# Patient Record
Sex: Female | Born: 1980 | Race: White | Hispanic: No | Smoking: Current every day smoker
Health system: Southern US, Community
[De-identification: ages and names within clinical notes are randomized; demographics above are authoritative.]

## PROBLEM LIST (undated history)

## (undated) DIAGNOSIS — F419 Anxiety disorder, unspecified: Secondary | ICD-10-CM

## (undated) DIAGNOSIS — M25512 Pain in left shoulder: Secondary | ICD-10-CM

## (undated) DIAGNOSIS — M542 Cervicalgia: Secondary | ICD-10-CM

## (undated) DIAGNOSIS — M25511 Pain in right shoulder: Secondary | ICD-10-CM

## (undated) DIAGNOSIS — C801 Malignant (primary) neoplasm, unspecified: Secondary | ICD-10-CM

## (undated) DIAGNOSIS — N809 Endometriosis, unspecified: Secondary | ICD-10-CM

## (undated) DIAGNOSIS — G8929 Other chronic pain: Secondary | ICD-10-CM

## (undated) DIAGNOSIS — G43909 Migraine, unspecified, not intractable, without status migrainosus: Secondary | ICD-10-CM

## (undated) DIAGNOSIS — F32A Depression, unspecified: Secondary | ICD-10-CM

## (undated) DIAGNOSIS — F329 Major depressive disorder, single episode, unspecified: Secondary | ICD-10-CM

## (undated) HISTORY — PX: LEEP: SHX91

## (undated) HISTORY — PX: TUBAL LIGATION: SHX77

---

## 2004-01-19 ENCOUNTER — Inpatient Hospital Stay (HOSPITAL_COMMUNITY): Admission: AD | Admit: 2004-01-19 | Discharge: 2004-01-19 | Payer: Self-pay | Admitting: Obstetrics and Gynecology

## 2005-08-04 ENCOUNTER — Inpatient Hospital Stay (HOSPITAL_COMMUNITY): Admission: AD | Admit: 2005-08-04 | Discharge: 2005-08-04 | Payer: Self-pay | Admitting: *Deleted

## 2009-11-06 ENCOUNTER — Ambulatory Visit: Payer: Self-pay | Admitting: Family Medicine

## 2009-11-06 DIAGNOSIS — N739 Female pelvic inflammatory disease, unspecified: Secondary | ICD-10-CM | POA: Insufficient documentation

## 2009-11-06 LAB — CONVERTED CEMR LAB
Ketones, urine, test strip: NEGATIVE
Specific Gravity, Urine: 1.025
Urobilinogen, UA: 0.2
Yeast Wet Prep HPF POC: NONE SEEN
pH: 7

## 2009-11-07 ENCOUNTER — Encounter: Payer: Self-pay | Admitting: Family Medicine

## 2009-11-07 LAB — CONVERTED CEMR LAB
Chlamydia, DNA Probe: NEGATIVE
GC Probe Amp, Genital: NEGATIVE

## 2015-06-19 ENCOUNTER — Emergency Department (HOSPITAL_COMMUNITY)
Admission: EM | Admit: 2015-06-19 | Discharge: 2015-06-19 | Disposition: A | Discharge status: Home / Self Care | Payer: Self-pay | Attending: Emergency Medicine | Admitting: Emergency Medicine

## 2015-06-19 ENCOUNTER — Encounter (HOSPITAL_COMMUNITY): Payer: Self-pay | Admitting: Emergency Medicine

## 2015-06-19 DIAGNOSIS — L089 Local infection of the skin and subcutaneous tissue, unspecified: Secondary | ICD-10-CM | POA: Insufficient documentation

## 2015-06-19 DIAGNOSIS — Z859 Personal history of malignant neoplasm, unspecified: Secondary | ICD-10-CM | POA: Insufficient documentation

## 2015-06-19 DIAGNOSIS — R197 Diarrhea, unspecified: Secondary | ICD-10-CM | POA: Insufficient documentation

## 2015-06-19 DIAGNOSIS — T798XXA Other early complications of trauma, initial encounter: Secondary | ICD-10-CM

## 2015-06-19 DIAGNOSIS — N939 Abnormal uterine and vaginal bleeding, unspecified: Secondary | ICD-10-CM | POA: Insufficient documentation

## 2015-06-19 DIAGNOSIS — Z720 Tobacco use: Secondary | ICD-10-CM | POA: Insufficient documentation

## 2015-06-19 HISTORY — DX: Malignant (primary) neoplasm, unspecified: C80.1

## 2015-06-19 MED ORDER — DOXYCYCLINE HYCLATE 100 MG PO CAPS: 100.0000 mg | ORAL_CAPSULE | Freq: Two times a day (BID) | ORAL | Status: DC

## 2015-06-19 MED ORDER — OXYCODONE-ACETAMINOPHEN 5-325 MG PO TABS
2.0000 | ORAL_TABLET | Freq: Once | ORAL | Status: AC
  Administered 2015-06-19: 2 via ORAL
  Filled 2015-06-19: qty 2

## 2015-06-19 MED ORDER — OXYCODONE-ACETAMINOPHEN 5-325 MG PO TABS: 1.0000 | ORAL_TABLET | ORAL | Status: DC | PRN

## 2015-06-19 MED ORDER — DOXYCYCLINE HYCLATE 100 MG PO TABS
100.0000 mg | ORAL_TABLET | Freq: Once | ORAL | Status: AC
  Administered 2015-06-19: 100 mg via ORAL
  Filled 2015-06-19: qty 1

## 2015-06-19 NOTE — Discharge Instructions (Signed)
You can also take benadryl for any itching or redness you may have

## 2015-06-19 NOTE — ED Notes (Signed)
Pt got tattoo to the rt forearm last week. Pt states the pain started getting bad yesterday.

## 2015-06-19 NOTE — ED Provider Notes (Signed)
CSN: [id number]     Arrival date & time 06/19/15  2011 History  This chart was scribed for Ripley Fraise, MD by Julien Nordmann, ED Scribe. This patient was seen in room [facility]17/[facility]17 and the patient's care was started at 8:38 PM.     Chief Complaint  Patient presents with  . Wound Infection  Patient gave verbal permission to utilize photo for medical documentation only The image was not stored on any personal device     Patient is a 34 y.o. female presenting with wound check. The history is provided by the patient. No language interpreter was used.  Wound Check This is a new problem. The current episode started yesterday. The problem occurs constantly. The problem has been gradually worsening. Pertinent negatives include no chest pain, no abdominal pain, no headaches and no shortness of breath. The symptoms are aggravated by exertion. Nothing relieves the symptoms.    HPI Comments: Maria Jenkins is a 34 y.o. female who presents to the Emergency Department complaining of a wound pain onset one day ago. She reports having associated diarrhea. Pt notes she got a tattoo on her right forearm last week and notes yesterday the pain started getting worse. She reports having prior tattoos but has never had an infection with tattoos before. Pt denies fever.  Past Medical History  Diagnosis Date  . Cancer   . Endometriosis of cervix    Past Surgical History  Procedure Laterality Date  . Leep     History reviewed. No pertinent family history. History  Substance Use Topics  . Smoking status: Current Every Day Smoker    Types: Cigarettes  . Smokeless tobacco: Not on file  . Alcohol Use: No   OB History    No data available     Review of Systems  Constitutional: Negative for fever.  Respiratory: Negative for shortness of breath.   Cardiovascular: Negative for chest pain.  Gastrointestinal: Negative for abdominal pain.  Genitourinary: Positive for vaginal bleeding.  Skin: Positive for  wound.  Neurological: Negative for headaches.  All other systems reviewed and are negative.     Allergies  Review of patient's allergies indicates not on file.  Home Medications   Prior to Admission medications   Not on File  Triage vitals: BP 119/76 mmHg  Pulse 92  Temp(Src) 97.6 F (36.4 C)  Resp 18  Ht 5\' 3"  (1.6 m)  Wt 110 lb (49.896 kg)  BMI 19.49 kg/m2  SpO2 100%  Physical Exam  CONSTITUTIONAL: Well developed/well nourished HEAD: Normocephalic/atraumatic EYES: EOMI/PERRL ENMT: Mucous membranes moist NECK: supple no meningeal signs SPINE/BACK:entire spine nontender CV: S1/S2 noted, no murmurs/rubs/gallops noted LUNGS: Lungs are clear to auscultation bilaterally, no apparent distress ABDOMEN: soft, nontender, no rebound or guarding, bowel sounds noted throughout abdomen GU:no cva tenderness NEURO: Pt is awake/alert/appropriate, moves all extremitiesx4.  No facial droop.   EXTREMITIES: pulses normal/equal, full ROM, no streaking to right arm, no crepitus, no drainage, full ROM of right wrist SKIN: warm, color normal, see photo PSYCH: no abnormalities of mood noted, alert and oriented to situation       ED Course  Procedures  DIAGNOSTIC STUDIES: Oxygen Saturation is 100% on RA, normal by my interpretation.  COORDINATION OF CARE:  8:42 PM Discussed treatment plan which includes percocet and doxycycline with pt at bedside and pt agreed to plan.   Pt without streaking, no crepitus, no edema, no signs of trauma, only localized wound tenderness Will place on doxycycline She can use  benadryl for any itching  Short course of pain meds Discussed strict return precautions   She also mentioned recent vag bleeding even though she is on DEPO shot and has been seen at an urgent care.  She denies pregnancy.  She denies any weakness.  She does not want further workup but reports she will f/u with GYN  MDM   Final diagnoses:  Wound infection, initial encounter     Nursing notes including past medical history and social history reviewed and considered in documentation   I personally performed the services described in this documentation, which was scribed in my presence. The recorded information has been reviewed and is accurate.      Ripley Fraise, MD 06/19/15 2118

## 2015-06-20 ENCOUNTER — Emergency Department (HOSPITAL_COMMUNITY): Payer: Self-pay

## 2015-06-20 ENCOUNTER — Emergency Department (HOSPITAL_COMMUNITY)
Admission: EM | Admit: 2015-06-20 | Discharge: 2015-06-20 | Disposition: A | Discharge status: Home / Self Care | Payer: Self-pay | Attending: Emergency Medicine | Admitting: Emergency Medicine

## 2015-06-20 ENCOUNTER — Encounter (HOSPITAL_COMMUNITY): Payer: Self-pay | Admitting: Emergency Medicine

## 2015-06-20 DIAGNOSIS — T798XXA Other early complications of trauma, initial encounter: Secondary | ICD-10-CM

## 2015-06-20 DIAGNOSIS — Z859 Personal history of malignant neoplasm, unspecified: Secondary | ICD-10-CM | POA: Insufficient documentation

## 2015-06-20 DIAGNOSIS — R5383 Other fatigue: Secondary | ICD-10-CM | POA: Insufficient documentation

## 2015-06-20 DIAGNOSIS — Z87448 Personal history of other diseases of urinary system: Secondary | ICD-10-CM | POA: Insufficient documentation

## 2015-06-20 DIAGNOSIS — L089 Local infection of the skin and subcutaneous tissue, unspecified: Secondary | ICD-10-CM | POA: Insufficient documentation

## 2015-06-20 DIAGNOSIS — Z720 Tobacco use: Secondary | ICD-10-CM | POA: Insufficient documentation

## 2015-06-20 DIAGNOSIS — Z792 Long term (current) use of antibiotics: Secondary | ICD-10-CM | POA: Insufficient documentation

## 2015-06-20 LAB — CBC WITH DIFFERENTIAL/PLATELET
BASOS ABS: 0 10*3/uL (ref 0.0–0.1)
Basophils Relative: 0 % (ref 0–1)
Eosinophils Absolute: 0.2 10*3/uL (ref 0.0–0.7)
Eosinophils Relative: 2 % (ref 0–5)
HCT: 37.9 % (ref 36.0–46.0)
Hemoglobin: 12.6 g/dL (ref 12.0–15.0)
LYMPHS PCT: 32 % (ref 12–46)
Lymphs Abs: 2.8 10*3/uL (ref 0.7–4.0)
MCH: 30.6 pg (ref 26.0–34.0)
MCHC: 33.2 g/dL (ref 30.0–36.0)
MCV: 92 fL (ref 78.0–100.0)
Monocytes Absolute: 0.5 10*3/uL (ref 0.1–1.0)
Monocytes Relative: 5 % (ref 3–12)
NEUTROS PCT: 61 % (ref 43–77)
Neutro Abs: 5.4 10*3/uL (ref 1.7–7.7)
Platelets: 264 10*3/uL (ref 150–400)
RBC: 4.12 MIL/uL (ref 3.87–5.11)
RDW: 14.9 % (ref 11.5–15.5)
WBC: 8.8 10*3/uL (ref 4.0–10.5)

## 2015-06-20 LAB — BASIC METABOLIC PANEL
Anion gap: 6 (ref 5–15)
BUN: 6 mg/dL (ref 6–20)
CALCIUM: 8.9 mg/dL (ref 8.9–10.3)
CO2: 27 mmol/L (ref 22–32)
CREATININE: 0.61 mg/dL (ref 0.44–1.00)
Chloride: 106 mmol/L (ref 101–111)
GFR calc Af Amer: >60 mL/min (ref >60)
GFR calc non Af Amer: >60 mL/min (ref >60)
GLUCOSE: 87 mg/dL (ref 65–99)
Potassium: 4.1 mmol/L (ref 3.5–5.1)
Sodium: 139 mmol/L (ref 135–145)

## 2015-06-20 LAB — LACTIC ACID, PLASMA: LACTIC ACID, VENOUS: 0.9 mmol/L (ref 0.5–2.0)

## 2015-06-20 MED ORDER — OXYCODONE-ACETAMINOPHEN 5-325 MG PO TABS: 1.0000 | ORAL_TABLET | Freq: Four times a day (QID) | ORAL | Status: DC | PRN

## 2015-06-20 MED ORDER — CLINDAMYCIN PHOSPHATE 600 MG/50ML IV SOLN
600.0000 mg | Freq: Once | INTRAVENOUS | Status: AC
  Administered 2015-06-20: 600 mg via INTRAVENOUS
  Filled 2015-06-20: qty 50

## 2015-06-20 MED ORDER — ONDANSETRON HCL 4 MG/2ML IJ SOLN: 4.0000 mg | Freq: Once | INTRAMUSCULAR | Status: DC

## 2015-06-20 MED ORDER — OXYCODONE-ACETAMINOPHEN 5-325 MG PO TABS
1.0000 | ORAL_TABLET | Freq: Once | ORAL | Status: AC
  Administered 2015-06-20: 1 via ORAL
  Filled 2015-06-20: qty 1

## 2015-06-20 MED ORDER — FENTANYL CITRATE (PF) 100 MCG/2ML IJ SOLN
50.0000 ug | Freq: Once | INTRAMUSCULAR | Status: AC
  Administered 2015-06-20: 50 ug via INTRAVENOUS
  Filled 2015-06-20: qty 2

## 2015-06-20 MED ORDER — MELOXICAM 15 MG PO TABS: 15.0000 mg | ORAL_TABLET | Freq: Every day | ORAL | Status: AC

## 2015-06-20 MED ORDER — PROMETHAZINE HCL 12.5 MG PO TABS
12.5000 mg | ORAL_TABLET | Freq: Once | ORAL | Status: AC
  Administered 2015-06-20: 12.5 mg via ORAL
  Filled 2015-06-20: qty 1

## 2015-06-20 MED ORDER — KETOROLAC TROMETHAMINE 10 MG PO TABS
10.0000 mg | ORAL_TABLET | Freq: Once | ORAL | Status: AC
  Administered 2015-06-20: 10 mg via ORAL
  Filled 2015-06-20: qty 1

## 2015-06-20 MED ORDER — ONDANSETRON HCL 4 MG/2ML IJ SOLN
4.0000 mg | Freq: Once | INTRAMUSCULAR | Status: AC
  Administered 2015-06-20: 4 mg via INTRAVENOUS
  Filled 2015-06-20: qty 2

## 2015-06-20 MED ORDER — BACITRACIN-NEOMYCIN-POLYMYXIN 400-5-5000 EX OINT
TOPICAL_OINTMENT | Freq: Once | CUTANEOUS | Status: AC
  Administered 2015-06-20: 1 via TOPICAL
  Filled 2015-06-20: qty 1

## 2015-06-20 NOTE — ED Notes (Signed)
Patient was seen here last night for an infection where she received a tattoo 1 week ago. States she was put on antibiotics and told to come back if worse. States pain and swelling is worse today.

## 2015-06-20 NOTE — Discharge Instructions (Signed)
Please soak your arm in warm Epsom salt water for 10-15 minutes daily, then apply Neosporin/health for dressing until the wounds heal. Please finish the doxycycline as previously ordered. Please use mobile daily, may use Tylenol for mild to moderate pain, may use Percocet for more severe pain. Percocet may cause drowsiness, please use this medication with caution. Please set up a follow-up appointment with Dr. Laverta Baltimore for additional evaluation of your wound. Your complete blood count, vital signs were all well within normal limits tonight.

## 2015-06-21 NOTE — ED Provider Notes (Signed)
CSN: [id number]     Arrival date & time 06/20/15  1736 History   First MD Initiated Contact with Patient 06/20/15 1750     Chief Complaint  Patient presents with  . Wound Infection     (Consider location/radiation/quality/duration/timing/severity/associated sxs/prior Treatment) HPI Comments: Patient is a 34 year old female who presents to the emergency department for recheck of a wound, and for additional evaluation. The patient states that she was seen in the emergency department on June 22 for evaluation of an infection related to a tattoo. The patient had the tattoo done approximately a week ago. She was evaluated and found to have signs of infection. She was treated with doxycycline and pain medication. She presents today because she states that she is feeling worse, she is having excruciating pain, and she is concerned that something bad is happening. There's been no high fever reported. The patient states she has noted some mild drainage present, and she also feels like the area might be swelling some.  The history is provided by the patient.    Past Medical History  Diagnosis Date  . Cancer   . Endometriosis of cervix    Past Surgical History  Procedure Laterality Date  . Leep     History reviewed. No pertinent family history. History  Substance Use Topics  . Smoking status: Current Every Day Smoker    Types: Cigarettes  . Smokeless tobacco: Not on file  . Alcohol Use: No   OB History    No data available     Review of Systems  Constitutional: Positive for appetite change and fatigue.  Skin: Positive for wound.  All other systems reviewed and are negative.     Allergies  Review of patient's allergies indicates no known allergies.  Home Medications   Prior to Admission medications   Medication Sig Start Date End Date Taking? Authorizing Provider  ALPRAZolam Duanne Moron) 1 MG tablet Take 1 mg by mouth 3 (three) times daily as needed for anxiety.  05/10/15  Yes  Historical Provider, MD  doxycycline (VIBRAMYCIN) 100 MG capsule Take 1 capsule (100 mg total) by mouth 2 (two) times daily. One po bid x 7 days 06/19/15  Yes Ripley Fraise, MD  sertraline (ZOLOFT) 100 MG tablet Take 100 mg by mouth at bedtime. 04/12/15  Yes Historical Provider, MD  meloxicam (MOBIC) 15 MG tablet Take 1 tablet (15 mg total) by mouth daily. 06/20/15   Lily Kocher, PA-C  oxyCODONE-acetaminophen (PERCOCET/ROXICET) 5-325 MG per tablet Take 1 tablet by mouth every 6 (six) hours as needed. 06/20/15   Lily Kocher, PA-C   BP 123/71 mmHg  Pulse 81  Temp(Src) 97.6 F (36.4 C) (Oral)  Resp 18  Ht 5\' 3"  (1.6 m)  Wt 110 lb (49.896 kg)  BMI 19.49 kg/m2  SpO2 100%  LMP 06/06/2015 Physical Exam  Constitutional: She is oriented to person, place, and time. She appears well-developed and well-nourished.  Non-toxic appearance.  HENT:  Head: Normocephalic.  Right Ear: Tympanic membrane and external ear normal.  Left Ear: Tympanic membrane and external ear normal.  Eyes: EOM and lids are normal. Pupils are equal, round, and reactive to light.  Neck: Normal range of motion. Neck supple. Carotid bruit is not present.  Cardiovascular: Normal rate, regular rhythm, normal heart sounds, intact distal pulses and normal pulses.   Pulmonary/Chest: Breath sounds normal. No respiratory distress.  Abdominal: Soft. Bowel sounds are normal. There is no tenderness. There is no guarding.  Musculoskeletal: Normal range of motion.  Lymphadenopathy:       Head (right side): No submandibular adenopathy present.       Head (left side): No submandibular adenopathy present.    She has no cervical adenopathy.  Neurological: She is alert and oriented to person, place, and time. She has normal strength. No cranial nerve deficit or sensory deficit.  Skin: Skin is warm and dry.  Patient has a tattoo of the right forearm, and the side of the right hand. There are multiple denuded ulcers throughout the tattoo area.  There no red streaks going up the arm. The area is warm, but not hot. There no palpable on no of the biceps triceps or the axilla area.  Psychiatric: Her speech is normal. Her mood appears anxious.  Nursing note and vitals reviewed.   ED Course  Patient seen with me by Dr. Lacinda Axon.   Procedures (including critical care time) Labs Review Labs Reviewed  CBC WITH DIFFERENTIAL/PLATELET  LACTIC ACID, PLASMA  BASIC METABOLIC PANEL    Imaging Review Dg Forearm Right  06/20/2015   CLINICAL DATA:  Severe right forearm pain since tattoo was placed 1 week ago. Possible infection. Initial encounter.  EXAM: RIGHT FOREARM - 2 VIEW  COMPARISON:  None.  FINDINGS: There is no evidence of fracture or other focal bone lesions. Soft tissues are unremarkable. No radiopaque foreign body seen.  IMPRESSION: Normal right forearm.   Electronically Signed   By: Marijo Conception, M.D.   On: 06/20/2015 19:01     EKG Interpretation None      MDM The lactic acid is normal at 0.9. The complete blood count is normal, and the basic metabolic panel is also normal. X-ray of the right forearm is negative for gas, there no bone lesions or bony involvement.  The patient was treated with clindamycin intravenously. Patient was also given pain medication intravenously. The patient is very concerned about her pain and the inability to rest because of this issue. A prescription for Percocet and mobile given to the patient. The patient will complete the doxycycline as previously ordered. The patient is to follow-up with Dr. Laverta Baltimore, or return to the emergency department if not improving.    Final diagnoses:  Wound infection, initial encounter    **I have reviewed nursing notes, vital signs, and all appropriate lab and imaging results for this patient.Lily Kocher, PA-C 06/21/15 0109  Nat Christen, MD 06/21/15 [phone]

## 2015-10-09 ENCOUNTER — Encounter (HOSPITAL_COMMUNITY): Payer: Self-pay | Admitting: Emergency Medicine

## 2015-10-09 ENCOUNTER — Emergency Department (HOSPITAL_COMMUNITY)
Admission: EM | Admit: 2015-10-09 | Discharge: 2015-10-09 | Disposition: A | Discharge status: Home / Self Care | Payer: Self-pay | Attending: Emergency Medicine | Admitting: Emergency Medicine

## 2015-10-09 DIAGNOSIS — Z791 Long term (current) use of non-steroidal anti-inflammatories (NSAID): Secondary | ICD-10-CM | POA: Insufficient documentation

## 2015-10-09 DIAGNOSIS — Z720 Tobacco use: Secondary | ICD-10-CM | POA: Insufficient documentation

## 2015-10-09 DIAGNOSIS — H9209 Otalgia, unspecified ear: Secondary | ICD-10-CM | POA: Insufficient documentation

## 2015-10-09 DIAGNOSIS — Z859 Personal history of malignant neoplasm, unspecified: Secondary | ICD-10-CM | POA: Insufficient documentation

## 2015-10-09 DIAGNOSIS — K0889 Other specified disorders of teeth and supporting structures: Secondary | ICD-10-CM | POA: Insufficient documentation

## 2015-10-09 DIAGNOSIS — Z8742 Personal history of other diseases of the female genital tract: Secondary | ICD-10-CM | POA: Insufficient documentation

## 2015-10-09 MED ORDER — AMOXICILLIN 500 MG PO CAPS: 500.0000 mg | ORAL_CAPSULE | Freq: Three times a day (TID) | ORAL | Status: DC

## 2015-10-09 MED ORDER — DICLOFENAC SODIUM 50 MG PO TBEC: 50.0000 mg | DELAYED_RELEASE_TABLET | Freq: Two times a day (BID) | ORAL | Status: AC

## 2015-10-09 MED ORDER — KETOROLAC TROMETHAMINE 60 MG/2ML IM SOLN
60.0000 mg | Freq: Once | INTRAMUSCULAR | Status: AC
  Administered 2015-10-09: 60 mg via INTRAMUSCULAR
  Filled 2015-10-09: qty 2

## 2015-10-09 NOTE — ED Provider Notes (Signed)
CSN: [id number]     Arrival date & time 10/09/15  1450 History   First MD Initiated Contact with Patient 10/09/15 1459     Chief Complaint  Patient presents with  . Otalgia     (Consider location/radiation/quality/duration/timing/severity/associated sxs/prior Treatment) Patient is a 34 y.o. female presenting with tooth pain. The history is provided by the patient. No language interpreter was used.  Dental Pain Location:  Upper Upper teeth location:  14/LU 1st molar and 13/LU 2nd bicuspid Quality:  Shooting and sharp Severity:  Severe Onset quality:  Gradual Duration:  1 day Timing:  Constant Progression:  Worsening Chronicity:  New Context: not abscess and normal dentition   Relieved by:  Nothing Worsened by:  Nothing tried Ineffective treatments:  None tried Associated symptoms: no facial swelling and no neck pain   Risk factors: no periodontal disease     Past Medical History  Diagnosis Date  . Cancer ([place])   . Endometriosis of cervix    Past Surgical History  Procedure Laterality Date  . Leep     History reviewed. No pertinent family history. Social History  Substance Use Topics  . Smoking status: Current Every Day Smoker -- 0.50 packs/day    Types: Cigarettes  . Smokeless tobacco: None  . Alcohol Use: No   OB History    No data available     Review of Systems  HENT: Positive for ear pain. Negative for facial swelling.   Musculoskeletal: Negative for neck pain.  All other systems reviewed and are negative.     Allergies  Review of patient's allergies indicates no known allergies.  Home Medications   Prior to Admission medications   Medication Sig Start Date End Date Taking? Authorizing Provider  ALPRAZolam Duanne Moron) 1 MG tablet Take 1 mg by mouth 3 (three) times daily as needed for anxiety.  05/10/15   Historical Provider, MD  amoxicillin (AMOXIL) 500 MG capsule Take 1 capsule (500 mg total) by mouth 3 (three) times daily. 10/09/15   Fransico Meadow,  PA-C  diclofenac (VOLTAREN) 50 MG EC tablet Take 1 tablet (50 mg total) by mouth 2 (two) times daily. 10/09/15   Fransico Meadow, PA-C  doxycycline (VIBRAMYCIN) 100 MG capsule Take 1 capsule (100 mg total) by mouth 2 (two) times daily. One po bid x 7 days 06/19/15   Ripley Fraise, MD  meloxicam (MOBIC) 15 MG tablet Take 1 tablet (15 mg total) by mouth daily. 06/20/15   Lily Kocher, PA-C  oxyCODONE-acetaminophen (PERCOCET/ROXICET) 5-325 MG per tablet Take 1 tablet by mouth every 6 (six) hours as needed. 06/20/15   Lily Kocher, PA-C  sertraline (ZOLOFT) 100 MG tablet Take 100 mg by mouth at bedtime. 04/12/15   Historical Provider, MD   BP 132/84 mmHg  Pulse 106  Temp(Src) 97.9 F (36.6 C) (Oral)  Resp 16  Ht 5\' 3"  (1.6 m)  Wt 105 lb (47.628 kg)  BMI 18.60 kg/m2  SpO2 100%  LMP 09/25/2015 Physical Exam  Constitutional: She is oriented to person, place, and time. She appears well-developed and well-nourished.  HENT:  Head: Normocephalic and atraumatic.  Right Ear: External ear normal.  Left Ear: External ear normal.  Mouth/Throat: Oropharynx is clear and moist.  tms are clear.  Pt has slightly bluish area between teeth left upper mouth  No obvious cavity.  Eyes: Conjunctivae and EOM are normal. Pupils are equal, round, and reactive to light.  Neck: Normal range of motion.  Cardiovascular: Normal rate and normal heart  sounds.   Pulmonary/Chest: Effort normal.  Abdominal: Soft.  Musculoskeletal: Normal range of motion.  Neurological: She is alert and oriented to person, place, and time. She has normal reflexes.  Skin: Skin is warm.  Psychiatric: She has a normal mood and affect.  Nursing note and vitals reviewed.   ED Course  Procedures (including critical care time) Labs Review Labs Reviewed - No data to display  Imaging Review No results found. I have personally reviewed and evaluated these images and lab results as part of my medical decision-making.   EKG  Interpretation None      MDM   Final diagnoses:  Toothache    amoxicilian voltaren Pt given torodol IM.    Hollace Kinnier Darwin, PA-C 10/09/15 1554  Milton Ferguson, MD 10/10/15 1537

## 2015-10-09 NOTE — Discharge Instructions (Signed)

## 2015-10-09 NOTE — ED Notes (Signed)
Pt reports ear pain since yesterday in left ear. Pt denies drainage.  Pt has put peroxide in ear, but only made it worse.

## 2015-10-31 ENCOUNTER — Emergency Department (HOSPITAL_COMMUNITY): Payer: Self-pay

## 2015-10-31 ENCOUNTER — Encounter (HOSPITAL_COMMUNITY): Payer: Self-pay

## 2015-10-31 ENCOUNTER — Emergency Department (HOSPITAL_COMMUNITY)
Admission: EM | Admit: 2015-10-31 | Discharge: 2015-10-31 | Disposition: A | Discharge status: Home / Self Care | Payer: Self-pay | Attending: Emergency Medicine | Admitting: Emergency Medicine

## 2015-10-31 DIAGNOSIS — G43909 Migraine, unspecified, not intractable, without status migrainosus: Secondary | ICD-10-CM | POA: Insufficient documentation

## 2015-10-31 DIAGNOSIS — G8929 Other chronic pain: Secondary | ICD-10-CM | POA: Insufficient documentation

## 2015-10-31 DIAGNOSIS — R112 Nausea with vomiting, unspecified: Secondary | ICD-10-CM | POA: Insufficient documentation

## 2015-10-31 DIAGNOSIS — R197 Diarrhea, unspecified: Secondary | ICD-10-CM | POA: Insufficient documentation

## 2015-10-31 DIAGNOSIS — N760 Acute vaginitis: Secondary | ICD-10-CM | POA: Insufficient documentation

## 2015-10-31 DIAGNOSIS — Z791 Long term (current) use of non-steroidal anti-inflammatories (NSAID): Secondary | ICD-10-CM | POA: Insufficient documentation

## 2015-10-31 DIAGNOSIS — B9689 Other specified bacterial agents as the cause of diseases classified elsewhere: Secondary | ICD-10-CM

## 2015-10-31 DIAGNOSIS — Z3202 Encounter for pregnancy test, result negative: Secondary | ICD-10-CM | POA: Insufficient documentation

## 2015-10-31 DIAGNOSIS — Z792 Long term (current) use of antibiotics: Secondary | ICD-10-CM | POA: Insufficient documentation

## 2015-10-31 DIAGNOSIS — Z720 Tobacco use: Secondary | ICD-10-CM | POA: Insufficient documentation

## 2015-10-31 DIAGNOSIS — R109 Unspecified abdominal pain: Secondary | ICD-10-CM

## 2015-10-31 HISTORY — DX: Pain in left shoulder: M25.512

## 2015-10-31 HISTORY — DX: Cervicalgia: M54.2

## 2015-10-31 HISTORY — DX: Endometriosis, unspecified: N80.9

## 2015-10-31 HISTORY — DX: Migraine, unspecified, not intractable, without status migrainosus: G43.909

## 2015-10-31 HISTORY — DX: Other chronic pain: G89.29

## 2015-10-31 HISTORY — DX: Pain in right shoulder: M25.511

## 2015-10-31 LAB — COMPREHENSIVE METABOLIC PANEL
ALBUMIN: 4 g/dL (ref 3.5–5.0)
ALT: 12 U/L — ABNORMAL LOW (ref 14–54)
ANION GAP: 4 — AB (ref 5–15)
AST: 17 U/L (ref 15–41)
Alkaline Phosphatase: 57 U/L (ref 38–126)
BILIRUBIN TOTAL: 0.6 mg/dL (ref 0.3–1.2)
BUN: 12 mg/dL (ref 6–20)
CALCIUM: 9.1 mg/dL (ref 8.9–10.3)
CO2: 26 mmol/L (ref 22–32)
Chloride: 108 mmol/L (ref 101–111)
Creatinine, Ser: 0.54 mg/dL (ref 0.44–1.00)
Glucose, Bld: 93 mg/dL (ref 65–99)
POTASSIUM: 4.1 mmol/L (ref 3.5–5.1)
Sodium: 138 mmol/L (ref 135–145)
Total Protein: 6.9 g/dL (ref 6.5–8.1)

## 2015-10-31 LAB — URINALYSIS, ROUTINE W REFLEX MICROSCOPIC
BILIRUBIN URINE: NEGATIVE
GLUCOSE, UA: NEGATIVE mg/dL
HGB URINE DIPSTICK: NEGATIVE
KETONES UR: NEGATIVE mg/dL
Nitrite: NEGATIVE
PH: 7.5 (ref 5.0–8.0)
Protein, ur: NEGATIVE mg/dL
Specific Gravity, Urine: 1.02 (ref 1.005–1.030)
Urobilinogen, UA: 0.2 mg/dL (ref 0.0–1.0)

## 2015-10-31 LAB — PREGNANCY, URINE: PREG TEST UR: NEGATIVE

## 2015-10-31 LAB — CBC WITH DIFFERENTIAL/PLATELET
BASOS ABS: 0 10*3/uL (ref 0.0–0.1)
BASOS PCT: 0 %
Eosinophils Absolute: 0.2 10*3/uL (ref 0.0–0.7)
Eosinophils Relative: 2 %
HEMATOCRIT: 40.5 % (ref 36.0–46.0)
HEMOGLOBIN: 13.6 g/dL (ref 12.0–15.0)
Lymphocytes Relative: 21 %
Lymphs Abs: 2.1 10*3/uL (ref 0.7–4.0)
MCH: 30.6 pg (ref 26.0–34.0)
MCHC: 33.6 g/dL (ref 30.0–36.0)
MCV: 91 fL (ref 78.0–100.0)
Monocytes Absolute: 0.7 10*3/uL (ref 0.1–1.0)
Monocytes Relative: 7 %
NEUTROS ABS: 7 10*3/uL (ref 1.7–7.7)
NEUTROS PCT: 70 %
Platelets: 283 10*3/uL (ref 150–400)
RBC: 4.45 MIL/uL (ref 3.87–5.11)
RDW: 14.5 % (ref 11.5–15.5)
WBC: 9.9 10*3/uL (ref 4.0–10.5)

## 2015-10-31 LAB — WET PREP, GENITAL
Trich, Wet Prep: NONE SEEN
Yeast Wet Prep HPF POC: NONE SEEN

## 2015-10-31 LAB — URINE MICROSCOPIC-ADD ON

## 2015-10-31 LAB — LIPASE, BLOOD: LIPASE: 26 U/L (ref 11–51)

## 2015-10-31 MED ORDER — ONDANSETRON HCL 4 MG/2ML IJ SOLN
4.0000 mg | INTRAMUSCULAR | Status: AC | PRN
  Administered 2015-10-31 (×2): 4 mg via INTRAVENOUS
  Filled 2015-10-31 (×2): qty 2

## 2015-10-31 MED ORDER — NAPROXEN 250 MG PO TABS: 250.0000 mg | ORAL_TABLET | Freq: Two times a day (BID) | ORAL | Status: AC | PRN

## 2015-10-31 MED ORDER — PROMETHAZINE HCL 25 MG PO TABS: 25.0000 mg | ORAL_TABLET | Freq: Four times a day (QID) | ORAL | Status: AC | PRN

## 2015-10-31 MED ORDER — SODIUM CHLORIDE 0.9 % IV BOLUS (SEPSIS)
1000.0000 mL | Freq: Once | INTRAVENOUS | Status: AC
  Administered 2015-10-31: 1000 mL via INTRAVENOUS

## 2015-10-31 MED ORDER — IOHEXOL 300 MG/ML  SOLN
100.0000 mL | Freq: Once | INTRAMUSCULAR | Status: AC | PRN
  Administered 2015-10-31: 100 mL via INTRAVENOUS

## 2015-10-31 MED ORDER — TRAMADOL HCL 50 MG PO TABS: 50.0000 mg | ORAL_TABLET | Freq: Four times a day (QID) | ORAL | Status: AC | PRN

## 2015-10-31 MED ORDER — KETOROLAC TROMETHAMINE 30 MG/ML IJ SOLN
30.0000 mg | Freq: Once | INTRAMUSCULAR | Status: AC
  Administered 2015-10-31: 30 mg via INTRAVENOUS
  Filled 2015-10-31: qty 1

## 2015-10-31 MED ORDER — SODIUM CHLORIDE 0.9 % IV SOLN
INTRAVENOUS | Status: DC
  Administered 2015-10-31: 12:00:00 via INTRAVENOUS

## 2015-10-31 MED ORDER — MORPHINE SULFATE (PF) 4 MG/ML IV SOLN
4.0000 mg | INTRAVENOUS | Status: AC | PRN
  Administered 2015-10-31 (×2): 4 mg via INTRAVENOUS
  Filled 2015-10-31 (×2): qty 1

## 2015-10-31 MED ORDER — METRONIDAZOLE 500 MG PO TABS: 500.0000 mg | ORAL_TABLET | Freq: Two times a day (BID) | ORAL | Status: AC

## 2015-10-31 NOTE — ED Notes (Signed)
Patient with no complaints at this time. Respirations even and unlabored. Skin warm/dry. Discharge instructions reviewed with patient at this time. Patient given opportunity to voice concerns/ask questions. IV removed per policy and band-aid applied to site. Patient discharged at this time and left Emergency Department with steady gait.  

## 2015-10-31 NOTE — ED Notes (Signed)
Pt c/o n/v/d and lower abd pain since yesterday.

## 2015-10-31 NOTE — Discharge Instructions (Signed)
°Emergency Department Resource Guide °1) Find a Doctor and Pay Out of Pocket °Although you won't have to find out who is covered by your insurance plan, it is a good idea to ask around and get recommendations. You will then need to call the office and see if the doctor you have chosen will accept you as a new patient and what types of options they offer for patients who are self-pay. Some doctors offer discounts or will set up payment plans for their patients who do not have insurance, but you will need to ask so you aren't surprised when you get to your appointment. ° °2) Contact Your Local Health Department °Not all health departments have doctors that can see patients for sick visits, but many do, so it is worth a call to see if yours does. If you don't know where your local health department is, you can check in your phone book. The CDC also has a tool to help you locate your state's health department, and many state websites also have listings of all of their local health departments. ° °3) Find a Walk-in Clinic °If your illness is not likely to be very severe or complicated, you may want to try a walk in clinic. These are popping up all over the country in pharmacies, drugstores, and shopping centers. They're usually staffed by nurse practitioners or physician assistants that have been trained to treat common illnesses and complaints. They're usually fairly quick and inexpensive. However, if you have serious medical issues or chronic medical problems, these are probably not your best option. ° °No Primary Care Doctor: °- Call [facility] at  832-8000 - they can help you locate a primary care doctor that  accepts your insurance, provides certain services, etc. °- Physician Referral Service- 1-800-533-3463 ° °Chronic Pain Problems: °Organization         Address  Phone   Notes  °[facility]  (336) 297-2271 Patients need to be referred by their primary care doctor.  ° °Medication  Assistance: °Organization         Address  Phone   Notes  °[place] [facility] [address] °[place], [place] [address] (336) 641-8030 --Must be a resident of [place] °-- Must have NO insurance coverage whatsoever (no Medicaid/ Medicare, etc.) °-- The pt. MUST have a primary care doctor that directs their care regularly and follows them in the community °  °[facility]  (866) 331-1348   °[employer]  (888) 892-1162   ° °Agencies that provide inexpensive medical care: °Organization         Address  Phone   Notes  °[facility]  (336) 832-8035   °[facility]    (336) 832-7272   °[facility] [address] °[place], [place] [address] (336) 832-4777   °[facility] [address], °[place] (336) 271-4999   °[facility]    (336) 373-0678   °[facility]    (336) 272-1050   °[facility] ° [address], [place] Phone:  (336) 832-4444, Fax:  (336) 832-4440 Hours of Operation:  9 am - 6 pm, M-F.  Also accepts Medicaid/Medicare and self-pay.  °[facility] ° [address],  Phone: (336) 832-3150, Fax: (336) [phone]. Hours of Operation:  8:30 am - 5:30 pm, M-F.  Also accepts Medicaid and self-pay.  °[facility] [place] [address], [place] Phone: (336) 878-6027   °[facility] [address], [place], [place] (336)723-1848, Ext. 123 Mondays & Thursdays: 7-9 AM.  First 15 patients are seen on a first come, first serve basis. °  ° °Medicaid-accepting [place] Providers: ° °Organization         Address  Phone   Notes  °[facility] [address], [place] (336) 641-2100 Also accepts self-pay patients.  °[facility] [address], [place] ° (336) 856-9996   °[facility] [address], [place]  (336) 288-8857   °[facility] [address] [place] [address], [place] (336) 299-7000   °Veita Bland [address], [place]  ° (336) 373-1557 Only accepts [place] Access Medicaid patients after they have their name applied to their card.  ° °Self-Pay (no insurance) in [place]: ° °Organization         Address  Phone   Notes  °Sickle Cell Patients, [facility] [address], [place] (336) 832-1970   °[facility] [address], [place] (336) 832-4400   °[facility] ° [address], [place] (336) 992-4800   °[facility]/Dr. Osei-Bonsu ° [address] [place] [address], [place] or [address], [place] (336) [phone] Phone number for both [place] and [place] locations is the same.  °[facility] [address], [place] (336) 299-0000   °[facility] [address] [place] [address], [place] or [address] (336) 852-7530 °(336) 878-2260   °[facility] [address], [place] (336) 350-1642, phone; (336) 294-5005, fax Sees patients 1st and 3rd Saturday of every month.  Must not qualify for public or private insurance (i.e. Medicaid, Medicare, [place] Health Choice, Veterans' Benefits) • Household income should be no more than 200% of the poverty level •The clinic cannot treat you if you are pregnant or think you are pregnant • Sexually transmitted diseases are not treated at the clinic.  ° ° °Dental Care: °Organization         Address  Phone  Notes  °[place] [facility] [address], [place] (336) 641-6152 Accepts children up to age 21 who are enrolled in Medicaid or [place] Health Choice; pregnant women with a Medicaid card; and children who have applied for Medicaid or [place] Health Choice, but were declined, whose parents can pay a reduced fee at time of service.  °[place]  [facility] [place]  [address], [place] (336) 641-7733 Accepts children up to age 21 who are enrolled in Medicaid or [place] Health Choice; pregnant women with a Medicaid card; and children who have applied for Medicaid or [place] Health Choice, but were declined, whose parents can pay a reduced fee at time of service.  °[facility] ° [address], [place] (336) 641-4533 Patients are seen by appointment only. Walk-ins are not accepted. [facility] will see patients 18 years of age and older. °Monday - Tuesday (8am-5pm) °Most Wednesdays (8:30-5pm) °$30 per visit, cash only  °[facility] ° [address], [place] (336) 641-4533 Patients are seen by appointment only. Walk-ins are not accepted. [facility] will see patients 18 years of age and older. °One   Wednesday Evening (Monthly: Volunteer Based).  $30 per visit, cash only  °[facility]  (919) [phone] for adults; Children under age 4, call Graduate Pediatric Dentistry at ([phone]) [phone]. Children aged 4-14, please call ([phone]) [phone] to request a pediatric application. ° Dental services are provided in all areas of dental care including fillings, crowns and bridges, complete and partial dentures, implants, gum treatment, root canals, and extractions. Preventive care is also provided. Treatment is provided to both adults and children. °Patients are selected via a lottery and there is often a waiting list. °  °[facility] [address], °[place] ° (336) 763-8833 [email or web address].[email or web address] °  °[facility] [address], [place], [place] (336)723-1848, Ext. 123 Second and Fourth Thursday of each month, opens at 6:30 AM; Clinic ends at 9 AM.  Patients are seen on a first-come first-served basis, and a limited number are seen during each clinic.  ° °[facility] ° [address], [place], [place] (336) 723-7904    Eligibility Requirements °You must have lived in [place], [place], or [place] counties for at least the last three months. °  You cannot be eligible for state or federal sponsored healthcare insurance, including Veterans Administration, Medicaid, or Medicare. °  You generally cannot be eligible for healthcare insurance through your employer.  °  How to apply: °Eligibility screenings are held every Tuesday and Wednesday afternoon from 1:00 pm until 4:00 pm. You do not need an appointment for the interview!  °[facility] [address], [place], [place] 336-631-2330   °[facility]  336-342-8273   °[place]  336-703-3100   ° [facility]  336-570-6415   ° °Behavioral Health Resources in the Community: °Intensive Outpatient Programs °Organization         Address  Phone  Notes  °[facility] [address], [place], [place] 336-878-6098   °[facility] [address], [place], [place] 336-832-9800   °[facility] [address], [place], [place] ° 336-882-2125   °[facility] [address],  °[place], [place] 1-800-853-5163 or 336-641-4981   °Substance Abuse Resources °Organization         Address  Phone  Notes  °[facility]  336-882-2125   °[facility]  336-784-9470   °[facility]  336-285-9073   °[facility]  336-845-3988   °Residential & Outpatient Substance Abuse Program  1-800-659-3381   °Psychological Services °Organization         Address  Phone  Notes  °[facility]  336- 832-9600   °[facility]  336- 378-7881   °[facility] [address], [place] 1-800-853-5163 or 336-641-4981   ° °Mobile Crisis Teams °Organization         Address  Phone  Notes  °[facility], Mobile Crisis Care Unit  1-877-626-1772   °Assertive °[facility] ° [address]  [place], [place] 336-834-9664   °Sharon DeEsch [address] °[place] 336-554-5454   ° °Self-Help/Support Groups °Organization         Address  Phone             Notes  °[facility] [place] - variety of support groups  336- 373-1402 Call for more information  °Narcotics Anonymous (NA), [facility] [address], °[place]  2 meetings at this location  ° °  Residential Treatment Programs Organization         Address  Phone  Notes  [facility] [address],    [place]  1-712-288-0649   [facility]  [address], [place] [address], [place], [place]   [place], [place] (743)737-5769 Admissions: 8am-3pm M-F  [facility] [address]    [place], [place] 888-280-0349   [facility] [address] [place], [place], [place]   [facility] [address],  [place], [place]   [facility] Intensive Outpatient [address], [place], [place]   [facility] [place]  [place], [place] 1-623-720-5855 or (214)642-9921   [facility] [address], [place], [place] Accepts Medicaid  [facility] [address],  [place] 1-(306)670-1406 Substance Abuse/Addiction Treatment   [facility]         Address  Phone  Notes  [facility]  (929)375-3568   Domenic Schwab, PhD [address] [place], [place]   534-160-9320 or 860-824-4541   [place], [place] 754-261-5277   [facility] [address] [place], [place] 613-718-4068 Insurance/Medicaid/sponsorship through [facility] [address]                                    [place], [place] [phone] [place] [address], [place] [phone]    Dr. Adele Schilder  [phone]   [facility] Dept. 1) [address], [place] 2) [place] 3)  [address], [place] [phone]  8452033692   [place] 770-338-1757 or [phone] (After Hours)      Take the prescriptions as directed.  Increase your fluid intake (ie:  Gatoraide) for the next few days, as discussed.  Eat a bland diet and advance to your regular diet slowly as you can tolerate it.   Avoid full strength juices, as well as milk and milk products until your diarrhea has resolved.   Call your regular medical doctor today to schedule a follow up appointment this week.  Return to the Emergency Department immediately if not improving (or even worsening) despite taking the medicines as prescribed, any black or bloody stool or vomit, if you develop a fever over "101," or for any other concerns.

## 2015-10-31 NOTE — ED Provider Notes (Signed)
CSN: [id number]     Arrival date & time 10/31/15  2458 History   First MD Initiated Contact with Patient 10/31/15 0900     Chief Complaint  Patient presents with  . Abdominal Pain      HPI Pt was seen at 0905.  Per pt, c/o gradual onset and persistence of multiple intermittent episodes of N/V/D that began 2 days ago.  Describes the stools as "watery." Has been associated with diffuse, but esp RLQ, abd "pain." Denies CP/SOB, no back pain, no fevers, no black or blood in stools or emesis, no dysuria/hematuria, no vaginal bleeding/discharge.     Past Medical History  Diagnosis Date  . Cancer ([place])   . Endometriosis   . Migraine headache   . Chronic pain of both shoulders   . Chronic neck pain    Past Surgical History  Procedure Laterality Date  . Leep      Social History  Substance Use Topics  . Smoking status: Current Every Day Smoker -- 0.50 packs/day    Types: Cigarettes  . Smokeless tobacco: None  . Alcohol Use: No    Review of Systems ROS: Statement: All systems negative except as marked or noted in the HPI; Constitutional: Negative for fever and chills. ; ; Eyes: Negative for eye pain, redness and discharge. ; ; ENMT: Negative for ear pain, hoarseness, nasal congestion, sinus pressure and sore throat. ; ; Cardiovascular: Negative for chest pain, palpitations, diaphoresis, dyspnea and peripheral edema. ; ; Respiratory: Negative for cough, wheezing and stridor. ; ; Gastrointestinal: +N/V/D, abd pain. Negative for blood in stool, hematemesis, jaundice and rectal bleeding. . ; ; Genitourinary: Negative for dysuria, flank pain and hematuria. ; ; GYN:  No vaginal bleeding, no vaginal discharge, no vulvar pain. ;; Musculoskeletal: Negative for back pain and neck pain. Negative for swelling and trauma.; ; Skin: Negative for pruritus, rash, abrasions, blisters, bruising and skin lesion.; ; Neuro: Negative for headache, lightheadedness and neck stiffness. Negative for weakness, altered  level of consciousness , altered mental status, extremity weakness, paresthesias, involuntary movement, seizure and syncope.     Allergies  Review of patient's allergies indicates no known allergies.  Home Medications   Prior to Admission medications   Medication Sig Start Date End Date Taking? Authorizing Provider  ALPRAZolam Duanne Moron) 1 MG tablet Take 1 mg by mouth 3 (three) times daily as needed for anxiety.  05/10/15   Historical Provider, MD  amoxicillin (AMOXIL) 500 MG capsule Take 1 capsule (500 mg total) by mouth 3 (three) times daily. 10/09/15   Fransico Meadow, PA-C  diclofenac (VOLTAREN) 50 MG EC tablet Take 1 tablet (50 mg total) by mouth 2 (two) times daily. 10/09/15   Fransico Meadow, PA-C  doxycycline (VIBRAMYCIN) 100 MG capsule Take 1 capsule (100 mg total) by mouth 2 (two) times daily. One po bid x 7 days 06/19/15   Ripley Fraise, MD  meloxicam (MOBIC) 15 MG tablet Take 1 tablet (15 mg total) by mouth daily. 06/20/15   Lily Kocher, PA-C  oxyCODONE-acetaminophen (PERCOCET/ROXICET) 5-325 MG per tablet Take 1 tablet by mouth every 6 (six) hours as needed. 06/20/15   Lily Kocher, PA-C  sertraline (ZOLOFT) 100 MG tablet Take 100 mg by mouth at bedtime. 04/12/15   Historical Provider, MD   BP 120/71 mmHg  Pulse 87  Temp(Src) 98.6 F (37 C) (Oral)  Resp 18  Ht 5\' 3"  (1.6 m)  Wt 104 lb (47.174 kg)  BMI 18.43 kg/m2  SpO2 100%  LMP 09/25/2015 Physical Exam  0910: Physical examination:  Nursing notes reviewed; Vital signs and O2 SAT reviewed;  Constitutional: Well developed, Well nourished, Well hydrated, Uncomfortable appearing.; Head:  Normocephalic, atraumatic; Eyes: EOMI, PERRL, No scleral icterus; ENMT: Mouth and pharynx normal, Mucous membranes moist; Neck: Supple, Full range of motion, No lymphadenopathy; Cardiovascular: Regular rate and rhythm, No murmur, rub, or gallop; Respiratory: Breath sounds clear & equal bilaterally, No rales, rhonchi, wheezes.  Speaking full sentences  with ease, Normal respiratory effort/excursion; Chest: Nontender, Movement normal; Abdomen: Soft, +diffuse tenderness to palp. Nondistended, Normal bowel sounds; Genitourinary: No CVA tenderness.; Extremities: Pulses normal, No tenderness, No edema, No calf edema or asymmetry.; Neuro: AA&Ox3, Major CN grossly intact.  Speech clear. No gross focal motor or sensory deficits in extremities.; Skin: Color normal, Warm, Dry.   ED Course  Procedures (including critical care time) Labs Review   Imaging Review  I have personally reviewed and evaluated these images and lab results as part of my medical decision-making.   EKG Interpretation None      MDM  MDM Reviewed: previous chart, nursing note and vitals Reviewed previous: labs Interpretation: labs and CT scan     Results for orders placed or performed during the hospital encounter of 10/31/15  Wet prep, genital  Result Value Ref Range   Yeast Wet Prep HPF POC NONE SEEN NONE SEEN   Trich, Wet Prep NONE SEEN NONE SEEN   Clue Cells Wet Prep HPF POC MANY (A) NONE SEEN   WBC, Wet Prep HPF POC FEW (A) NONE SEEN  Pregnancy, urine  Result Value Ref Range   Preg Test, Ur NEGATIVE NEGATIVE  Urinalysis, Routine w reflex microscopic  Result Value Ref Range   Color, Urine YELLOW YELLOW   APPearance CLOUDY (A) CLEAR   Specific Gravity, Urine 1.020 1.005 - 1.030   pH 7.5 5.0 - 8.0   Glucose, UA NEGATIVE NEGATIVE mg/dL   Hgb urine dipstick NEGATIVE NEGATIVE   Bilirubin Urine NEGATIVE NEGATIVE   Ketones, ur NEGATIVE NEGATIVE mg/dL   Protein, ur NEGATIVE NEGATIVE mg/dL   Urobilinogen, UA 0.2 0.0 - 1.0 mg/dL   Nitrite NEGATIVE NEGATIVE   Leukocytes, UA MODERATE (A) NEGATIVE  Comprehensive metabolic panel  Result Value Ref Range   Sodium 138 135 - 145 mmol/L   Potassium 4.1 3.5 - 5.1 mmol/L   Chloride 108 101 - 111 mmol/L   CO2 26 22 - 32 mmol/L   Glucose, Bld 93 65 - 99 mg/dL   BUN 12 6 - 20 mg/dL   Creatinine, Ser 0.54 0.44 - 1.00  mg/dL   Calcium 9.1 8.9 - 10.3 mg/dL   Total Protein 6.9 6.5 - 8.1 g/dL   Albumin 4.0 3.5 - 5.0 g/dL   AST 17 15 - 41 U/L   ALT 12 (L) 14 - 54 U/L   Alkaline Phosphatase 57 38 - 126 U/L   Total Bilirubin 0.6 0.3 - 1.2 mg/dL   GFR calc non Af Amer >60 >60 mL/min   GFR calc Af Amer >60 >60 mL/min   Anion gap 4 (L) 5 - 15  Lipase, blood  Result Value Ref Range   Lipase 26 11 - 51 U/L  CBC with Differential  Result Value Ref Range   WBC 9.9 4.0 - 10.5 K/uL   RBC 4.45 3.87 - 5.11 MIL/uL   Hemoglobin 13.6 12.0 - 15.0 g/dL   HCT 40.5 36.0 - 46.0 %   MCV 91.0 78.0 - 100.0 fL  MCH 30.6 26.0 - 34.0 pg   MCHC 33.6 30.0 - 36.0 g/dL   RDW 14.5 11.5 - 15.5 %   Platelets 283 150 - 400 K/uL   Neutrophils Relative % 70 %   Neutro Abs 7.0 1.7 - 7.7 K/uL   Lymphocytes Relative 21 %   Lymphs Abs 2.1 0.7 - 4.0 K/uL   Monocytes Relative 7 %   Monocytes Absolute 0.7 0.1 - 1.0 K/uL   Eosinophils Relative 2 %   Eosinophils Absolute 0.2 0.0 - 0.7 K/uL   Basophils Relative 0 %   Basophils Absolute 0.0 0.0 - 0.1 K/uL  Urine microscopic-add on  Result Value Ref Range   Squamous Epithelial / LPF MANY (A) RARE   WBC, UA 7-10 <3 WBC/hpf   RBC / HPF 0-2 <3 RBC/hpf   Ct Abdomen Pelvis W Contrast 10/31/2015  CLINICAL DATA:  Lower abdominal pain starting yesterday, history of cervical cancer EXAM: CT ABDOMEN AND PELVIS WITH CONTRAST TECHNIQUE: Multidetector CT imaging of the abdomen and pelvis was performed using the standard protocol following bolus administration of intravenous contrast. CONTRAST:  159mL OMNIPAQUE IOHEXOL 300 MG/ML  SOLN COMPARISON:  05/16/2012 FINDINGS: The lung bases are unremarkable. Sagittal images of the spine are unremarkable. Enhanced liver, pancreas, spleen and adrenal glands are unremarkable. No calcified gallstones are noted within gallbladder. Abdominal aorta is unremarkable. No hydronephrosis or hydroureter. No focal renal mass. No small bowel obstruction. No thickened or dilated  small bowel loops. No ascites or free air. No adenopathy. There is no pericecal inflammation. Normal appendix is noted in axial image 42. The terminal ileum is unremarkable. Anteflexed uterus is noted. There is a left ovarian cyst measures 2.2 cm. Moderate stool and gas noted within rectum. Mild distended urinary bladder. No thickening of bladder wall. IMPRESSION: 1. No acute inflammatory process within abdomen or pelvis. 2. No hydronephrosis or hydroureter. 3. Normal appendix.  No pericecal inflammation. 4. No small bowel obstruction. 5. Moderate stool and gas noted within rectum. 6. There is a left ovarian cyst measures 2.2 cm. Electronically Signed   By: Lahoma Crocker M.D.   On: 10/31/2015 12:06    1235:  Workup reassuring. Udip appears contaminated; UC is pending and pt denies dysuria. Pt questioning now "if the pain is my endometriosis?" Pelvic exam performed: Pelvic exam performed with permission of pt and female ED tech assist during exam.  External genitalia w/o lesions. Vaginal vault with thick white discharge.  Cervix w/o lesions, not friable, GC/chlam and wet prep obtained and sent to lab.  Bimanual exam w/o CMT or adnexal tenderness, +suprapubic tenderness.  Pt agrees to wait for wet prep results.   1345:  Pt has tol PO well while in the ED without N/V.  No stooling while in the ED.  Abd benign, VSS. Feels better and wants to go home now.  Dx and testing d/w pt and family.  Questions answered.  Verb understanding, agreeable to d/c home with outpt f/u.     Francine Graven, DO 11/03/15 2041

## 2015-11-01 LAB — URINE CULTURE

## 2015-11-01 LAB — GC/CHLAMYDIA PROBE AMP (~~LOC~~) NOT AT ARMC
Chlamydia: NEGATIVE
Neisseria Gonorrhea: NEGATIVE

## 2016-01-16 ENCOUNTER — Encounter (HOSPITAL_COMMUNITY): Payer: Self-pay | Admitting: Emergency Medicine

## 2016-01-16 ENCOUNTER — Emergency Department (HOSPITAL_COMMUNITY)
Admission: EM | Admit: 2016-01-16 | Discharge: 2016-01-16 | Discharge status: Against Medical Advice | Payer: Self-pay | Attending: Emergency Medicine | Admitting: Emergency Medicine

## 2016-01-16 ENCOUNTER — Emergency Department (HOSPITAL_COMMUNITY): Payer: Self-pay

## 2016-01-16 DIAGNOSIS — S4992XA Unspecified injury of left shoulder and upper arm, initial encounter: Secondary | ICD-10-CM | POA: Insufficient documentation

## 2016-01-16 DIAGNOSIS — Y9289 Other specified places as the place of occurrence of the external cause: Secondary | ICD-10-CM | POA: Insufficient documentation

## 2016-01-16 DIAGNOSIS — K859 Acute pancreatitis without necrosis or infection, unspecified: Secondary | ICD-10-CM | POA: Insufficient documentation

## 2016-01-16 DIAGNOSIS — F17210 Nicotine dependence, cigarettes, uncomplicated: Secondary | ICD-10-CM | POA: Insufficient documentation

## 2016-01-16 DIAGNOSIS — S4991XA Unspecified injury of right shoulder and upper arm, initial encounter: Secondary | ICD-10-CM | POA: Insufficient documentation

## 2016-01-16 DIAGNOSIS — Z3202 Encounter for pregnancy test, result negative: Secondary | ICD-10-CM | POA: Insufficient documentation

## 2016-01-16 DIAGNOSIS — W11XXXA Fall on and from ladder, initial encounter: Secondary | ICD-10-CM | POA: Insufficient documentation

## 2016-01-16 DIAGNOSIS — Y998 Other external cause status: Secondary | ICD-10-CM | POA: Insufficient documentation

## 2016-01-16 DIAGNOSIS — J029 Acute pharyngitis, unspecified: Secondary | ICD-10-CM | POA: Insufficient documentation

## 2016-01-16 DIAGNOSIS — Z79899 Other long term (current) drug therapy: Secondary | ICD-10-CM | POA: Insufficient documentation

## 2016-01-16 DIAGNOSIS — Z8679 Personal history of other diseases of the circulatory system: Secondary | ICD-10-CM | POA: Insufficient documentation

## 2016-01-16 DIAGNOSIS — Z859 Personal history of malignant neoplasm, unspecified: Secondary | ICD-10-CM | POA: Insufficient documentation

## 2016-01-16 DIAGNOSIS — G8929 Other chronic pain: Secondary | ICD-10-CM | POA: Insufficient documentation

## 2016-01-16 DIAGNOSIS — M25512 Pain in left shoulder: Secondary | ICD-10-CM

## 2016-01-16 DIAGNOSIS — Z8742 Personal history of other diseases of the female genital tract: Secondary | ICD-10-CM | POA: Insufficient documentation

## 2016-01-16 DIAGNOSIS — Y9389 Activity, other specified: Secondary | ICD-10-CM | POA: Insufficient documentation

## 2016-01-16 LAB — CBC
HCT: 37.5 % (ref 36.0–46.0)
Hemoglobin: 12.7 g/dL (ref 12.0–15.0)
MCH: 30.5 pg (ref 26.0–34.0)
MCHC: 33.9 g/dL (ref 30.0–36.0)
MCV: 90.1 fL (ref 78.0–100.0)
PLATELETS: 311 10*3/uL (ref 150–400)
RBC: 4.16 MIL/uL (ref 3.87–5.11)
RDW: 12.9 % (ref 11.5–15.5)
WBC: 13 10*3/uL — ABNORMAL HIGH (ref 4.0–10.5)

## 2016-01-16 LAB — URINE MICROSCOPIC-ADD ON

## 2016-01-16 LAB — COMPREHENSIVE METABOLIC PANEL
ALK PHOS: 75 U/L (ref 38–126)
ALT: 17 U/L (ref 14–54)
AST: 21 U/L (ref 15–41)
Albumin: 3.9 g/dL (ref 3.5–5.0)
Anion gap: 8 (ref 5–15)
BILIRUBIN TOTAL: 0.4 mg/dL (ref 0.3–1.2)
BUN: 9 mg/dL (ref 6–20)
CALCIUM: 8.7 mg/dL — AB (ref 8.9–10.3)
CO2: 26 mmol/L (ref 22–32)
CREATININE: 0.6 mg/dL (ref 0.44–1.00)
Chloride: 107 mmol/L (ref 101–111)
GFR calc Af Amer: >60 mL/min (ref >60)
GFR calc non Af Amer: >60 mL/min (ref >60)
GLUCOSE: 106 mg/dL — AB (ref 65–99)
Potassium: 3.5 mmol/L (ref 3.5–5.1)
SODIUM: 141 mmol/L (ref 135–145)
TOTAL PROTEIN: 6.9 g/dL (ref 6.5–8.1)

## 2016-01-16 LAB — URINALYSIS, ROUTINE W REFLEX MICROSCOPIC
Bilirubin Urine: NEGATIVE
GLUCOSE, UA: NEGATIVE mg/dL
Ketones, ur: NEGATIVE mg/dL
LEUKOCYTES UA: NEGATIVE
Nitrite: NEGATIVE
PH: 6.5 (ref 5.0–8.0)
PROTEIN: NEGATIVE mg/dL
SPECIFIC GRAVITY, URINE: 1.015 (ref 1.005–1.030)

## 2016-01-16 LAB — PREGNANCY, URINE: Preg Test, Ur: NEGATIVE

## 2016-01-16 LAB — LIPASE, BLOOD: Lipase: 163 U/L — ABNORMAL HIGH (ref 11–51)

## 2016-01-16 MED ORDER — KETOROLAC TROMETHAMINE 30 MG/ML IJ SOLN
INTRAMUSCULAR | Status: AC
  Filled 2016-01-16: qty 1

## 2016-01-16 MED ORDER — KETOROLAC TROMETHAMINE 60 MG/2ML IM SOLN: 60.0000 mg | Freq: Once | INTRAMUSCULAR | Status: DC

## 2016-01-16 MED ORDER — KETOROLAC TROMETHAMINE 30 MG/ML IJ SOLN
30.0000 mg | Freq: Once | INTRAMUSCULAR | Status: AC
  Administered 2016-01-16: 30 mg via INTRAVENOUS

## 2016-01-16 MED ORDER — ONDANSETRON HCL 4 MG/2ML IJ SOLN
4.0000 mg | Freq: Once | INTRAMUSCULAR | Status: AC | PRN
  Administered 2016-01-16: 4 mg via INTRAVENOUS
  Filled 2016-01-16: qty 2

## 2016-01-16 NOTE — ED Notes (Signed)
Returned from xray

## 2016-01-16 NOTE — ED Notes (Signed)
Pt states that she has been having left shoulder pain for "a while now", cannot grip with left hand x 2 days, vomiting x 2 days, sore throat, and feels that she is dehydrated.
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
## 2016-01-16 NOTE — ED Notes (Signed)
Patient was seen ambulatory out of department with steady gait. Patient states she took IV out of her own arm. IV not present at d/c. Patient states "im not waiting anymore." Dr. Rogene Houston made aware.

## 2016-01-16 NOTE — ED Notes (Signed)
Patient was advised on arrival NPO. Patient request for water. Informed patient NPO until nausea is resolved. Patient then called for writer to come into room.As Probation officer entered room patient was drinking tea. Patient informed NPO until resolved. Verbalizes understanding.

## 2016-01-16 NOTE — ED Notes (Signed)
Patient refused to go to xray until she got pain medication.

## 2016-11-01 ENCOUNTER — Encounter (HOSPITAL_COMMUNITY): Payer: Self-pay | Admitting: Emergency Medicine

## 2016-11-01 ENCOUNTER — Emergency Department (HOSPITAL_COMMUNITY)
Admission: EM | Admit: 2016-11-01 | Discharge: 2016-11-01 | Disposition: A | Discharge status: Home / Self Care | Payer: Medicaid Other | Attending: Emergency Medicine | Admitting: Emergency Medicine

## 2016-11-01 ENCOUNTER — Emergency Department (HOSPITAL_COMMUNITY): Payer: Medicaid Other

## 2016-11-01 DIAGNOSIS — Z8541 Personal history of malignant neoplasm of cervix uteri: Secondary | ICD-10-CM | POA: Insufficient documentation

## 2016-11-01 DIAGNOSIS — Z79899 Other long term (current) drug therapy: Secondary | ICD-10-CM | POA: Diagnosis not present

## 2016-11-01 DIAGNOSIS — R42 Dizziness and giddiness: Secondary | ICD-10-CM | POA: Insufficient documentation

## 2016-11-01 DIAGNOSIS — R1032 Left lower quadrant pain: Secondary | ICD-10-CM | POA: Diagnosis present

## 2016-11-01 DIAGNOSIS — IMO0002 Reserved for concepts with insufficient information to code with codable children: Secondary | ICD-10-CM

## 2016-11-01 DIAGNOSIS — R109 Unspecified abdominal pain: Secondary | ICD-10-CM

## 2016-11-01 DIAGNOSIS — O9989 Other specified diseases and conditions complicating pregnancy, childbirth and the puerperium: Secondary | ICD-10-CM | POA: Diagnosis not present

## 2016-11-01 DIAGNOSIS — Z791 Long term (current) use of non-steroidal anti-inflammatories (NSAID): Secondary | ICD-10-CM | POA: Insufficient documentation

## 2016-11-01 DIAGNOSIS — F17210 Nicotine dependence, cigarettes, uncomplicated: Secondary | ICD-10-CM | POA: Diagnosis not present

## 2016-11-01 DIAGNOSIS — R51 Headache: Secondary | ICD-10-CM | POA: Diagnosis not present

## 2016-11-01 HISTORY — DX: Anxiety disorder, unspecified: F41.9

## 2016-11-01 HISTORY — DX: Depression, unspecified: F32.A

## 2016-11-01 HISTORY — DX: Major depressive disorder, single episode, unspecified: F32.9

## 2016-11-01 LAB — BASIC METABOLIC PANEL
Anion gap: 8 (ref 5–15)
BUN: 13 mg/dL (ref 6–20)
CHLORIDE: 105 mmol/L (ref 101–111)
CO2: 25 mmol/L (ref 22–32)
Calcium: 9.4 mg/dL (ref 8.9–10.3)
Creatinine, Ser: 0.68 mg/dL (ref 0.44–1.00)
GFR calc non Af Amer: >60 mL/min (ref >60)
Glucose, Bld: 92 mg/dL (ref 65–99)
POTASSIUM: 3.3 mmol/L — AB (ref 3.5–5.1)
SODIUM: 138 mmol/L (ref 135–145)

## 2016-11-01 LAB — CBC WITH DIFFERENTIAL/PLATELET
Basophils Absolute: 0 10*3/uL (ref 0.0–0.1)
Basophils Relative: 1 %
EOS ABS: 0.3 10*3/uL (ref 0.0–0.7)
Eosinophils Relative: 4 %
HEMATOCRIT: 41.6 % (ref 36.0–46.0)
HEMOGLOBIN: 14 g/dL (ref 12.0–15.0)
LYMPHS ABS: 3 10*3/uL (ref 0.7–4.0)
LYMPHS PCT: 34 %
MCH: 29.5 pg (ref 26.0–34.0)
MCHC: 33.7 g/dL (ref 30.0–36.0)
MCV: 87.8 fL (ref 78.0–100.0)
MONOS PCT: 6 %
Monocytes Absolute: 0.5 10*3/uL (ref 0.1–1.0)
NEUTROS ABS: 4.9 10*3/uL (ref 1.7–7.7)
NEUTROS PCT: 55 %
Platelets: 317 10*3/uL (ref 150–400)
RBC: 4.74 MIL/uL (ref 3.87–5.11)
RDW: 13.3 % (ref 11.5–15.5)
WBC: 8.8 10*3/uL (ref 4.0–10.5)

## 2016-11-01 LAB — URINALYSIS, ROUTINE W REFLEX MICROSCOPIC
BILIRUBIN URINE: NEGATIVE
Glucose, UA: NEGATIVE mg/dL
Ketones, ur: NEGATIVE mg/dL
Nitrite: NEGATIVE
PH: 6.5 (ref 5.0–8.0)
Protein, ur: NEGATIVE mg/dL

## 2016-11-01 LAB — URINE MICROSCOPIC-ADD ON

## 2016-11-01 MED ORDER — KETOROLAC TROMETHAMINE 30 MG/ML IJ SOLN
30.0000 mg | Freq: Once | INTRAMUSCULAR | Status: AC
  Administered 2016-11-01: 30 mg via INTRAVENOUS
  Filled 2016-11-01: qty 1

## 2016-11-01 MED ORDER — IOPAMIDOL (ISOVUE-300) INJECTION 61%
INTRAVENOUS | Status: AC
  Administered 2016-11-01: 100 mL
  Filled 2016-11-01: qty 30

## 2016-11-01 MED ORDER — MORPHINE SULFATE (PF) 4 MG/ML IV SOLN
4.0000 mg | Freq: Once | INTRAVENOUS | Status: AC
  Administered 2016-11-01: 4 mg via INTRAVENOUS
  Filled 2016-11-01: qty 1

## 2016-11-01 MED ORDER — HYDROCODONE-ACETAMINOPHEN 5-325 MG PO TABS: 1.0000 | ORAL_TABLET | Freq: Four times a day (QID) | ORAL | 0 refills | Status: AC | PRN

## 2016-11-01 MED ORDER — SODIUM CHLORIDE 0.9 % IV BOLUS (SEPSIS)
1000.0000 mL | Freq: Once | INTRAVENOUS | Status: AC
  Administered 2016-11-01: 1000 mL via INTRAVENOUS

## 2016-11-01 MED ORDER — LORAZEPAM 2 MG/ML IJ SOLN
1.0000 mg | Freq: Once | INTRAMUSCULAR | Status: AC
  Administered 2016-11-01: 1 mg via INTRAVENOUS
  Filled 2016-11-01: qty 1

## 2016-11-01 MED ORDER — SODIUM CHLORIDE 0.9 % IV BOLUS (SEPSIS): 1000.0000 mL | Freq: Once | INTRAVENOUS | Status: DC

## 2016-11-01 NOTE — ED Notes (Signed)
Patient ambulatory to restroom with steady gait, clean catch instructions given and advised pt to bring specimen back to room as well.  

## 2016-11-01 NOTE — ED Notes (Signed)
Patient tearful and anxious at this time. Vitals WNL.

## 2016-11-01 NOTE — ED Triage Notes (Signed)
Pt reports she had a baby 3 weeks ago. 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 Pt was recently hospitalized for post partum eclampsia. Pt c/o headaches, pain around her abdominal incision, anxiety and depression. Pt denies SI, states she hasn't been put back on her Zoloft and Xanax since delivery.

## 2016-11-01 NOTE — ED Notes (Signed)
Pt back from CT

## 2016-11-01 NOTE — ED Provider Notes (Signed)
[facility] DEPT Provider Note   CSN: [id number] Arrival date & time: 11/01/16  1940  By signing my name below, I, Georgette Shell, attest that this documentation has been prepared under the direction and in the presence of Veryl Speak, MD. Electronically Signed: Georgette Shell, ED Scribe. 11/01/16. 8:20 PM.  History   Chief Complaint Chief Complaint  Patient presents with  . Anxiety  . Abdominal Pain   HPI Comments: Maria Jenkins is a 35 y.o. female with h/o cervical cancer, anxiety, migraines, and depression who presents to the Emergency Department complaining of 9/10 abdominal pain onset ~3 weeks ago, worsening a couple days ago. Pt reports the pain is worse around her abdominal incision where she had her tubal ligation. Pt states she had a baby 3 weeks ago. She notes she was hospitalized for post partum eclampsia shortly after having her baby. Pt is also complaining of headaches that have not resolved since giving birth; she notes associated dizziness and increased anxiety. Pt regularly takes Zoloft and Xanax for her anxiety but notes she has not been placed back on her medications since delivery. Pt denies fever, SI, or any other associated symptoms at this time.   The history is provided by the patient. No language interpreter was used.  Abdominal Pain   This is a new problem. The current episode started more than 1 week ago. The problem occurs constantly. The problem has been gradually worsening. The pain is associated with a previous surgery. The pain is located in the LLQ and suprapubic region. The pain is at a severity of 9/10. The pain is moderate. Associated symptoms include headaches. Pertinent negatives include fever.    Past Medical History:  Diagnosis Date  . Anxiety   . Cancer ([facility])    cervical  . Chronic neck pain   . Chronic pain of both shoulders   . Depression   . Endometriosis   . Migraine headache     Patient Active Problem List   Diagnosis Date Noted  . PELVIC  INFLAMMATORY DISEASE, ACUTE 11/06/2009    Past Surgical History:  Procedure Laterality Date  . LEEP    . TUBAL LIGATION      OB History    No data available       Home Medications    Prior to Admission medications   Medication Sig Start Date End Date Taking? Authorizing Provider  NIFEdipine (PROCARDIA-XL/ADALAT CC) 60 MG 24 hr tablet Take 1 tablet by mouth daily before breakfast. 10/20/16 10/20/17 Yes Historical Provider, MD  ALPRAZolam Duanne Moron) 1 MG tablet Take 1 mg by mouth 3 (three) times daily as needed for anxiety.  05/10/15   Historical Provider, MD  amoxicillin (AMOXIL) 500 MG capsule Take 1 capsule (500 mg total) by mouth 3 (three) times daily. Patient not taking: Reported on 10/31/2015 10/09/15   Fransico Meadow, PA-C  diclofenac (VOLTAREN) 50 MG EC tablet Take 1 tablet (50 mg total) by mouth 2 (two) times daily. Patient not taking: Reported on 10/31/2015 10/09/15   Fransico Meadow, PA-C  doxycycline (VIBRAMYCIN) 100 MG capsule Take 1 capsule (100 mg total) by mouth 2 (two) times daily. One po bid x 7 days Patient not taking: Reported on 10/31/2015 06/19/15   Ripley Fraise, MD  meloxicam (MOBIC) 15 MG tablet Take 1 tablet (15 mg total) by mouth daily. Patient not taking: Reported on 10/31/2015 06/20/15   Lily Kocher, PA-C  metroNIDAZOLE (FLAGYL) 500 MG tablet Take 1 tablet (500 mg total) by mouth 2 (  two) times daily. Patient not taking: Reported on 01/16/2016 10/31/15   Francine Graven, DO  naproxen (NAPROSYN) 250 MG tablet Take 1 tablet (250 mg total) by mouth 2 (two) times daily as needed for mild pain or moderate pain (take with food). Patient not taking: Reported on 01/16/2016 10/31/15   Francine Graven, DO  oxyCODONE-acetaminophen (PERCOCET/ROXICET) 5-325 MG per tablet Take 1 tablet by mouth every 6 (six) hours as needed. Patient not taking: Reported on 10/31/2015 06/20/15   Lily Kocher, PA-C  promethazine (PHENERGAN) 25 MG tablet Take 1 tablet (25 mg total) by mouth every 6  (six) hours as needed for nausea or vomiting. Patient not taking: Reported on 01/16/2016 10/31/15   Francine Graven, DO  traMADol (ULTRAM) 50 MG tablet Take 1 tablet (50 mg total) by mouth every 6 (six) hours as needed. Patient not taking: Reported on 01/16/2016 10/31/15   Francine Graven, DO    Family History No family history on file.  Social History Social History  Substance Use Topics  . Smoking status: Current Every Day Smoker    Packs/day: 0.50    Types: Cigarettes  . Smokeless tobacco: Not on file  . Alcohol use No     Allergies   Patient has no known allergies.   Review of Systems Review of Systems  Constitutional: Negative for fever.  Gastrointestinal: Positive for abdominal pain.  Neurological: Positive for dizziness and headaches.  Psychiatric/Behavioral: Negative for suicidal ideas.  All other systems reviewed and are negative.    Physical Exam Updated Vital Signs BP 125/88 (BP Location: Left Arm)   Pulse 92   Temp 97.6 F (36.4 C) (Oral)   Resp 18   Ht 5\' 3"  (1.6 m)   Wt 130 lb (59 kg)   SpO2 100%   BMI 23.03 kg/m   Physical Exam  Constitutional: She is oriented to person, place, and time. She appears well-developed and well-nourished. No distress.  Pt is tearful, hyperventilating, and anxious.  HENT:  Head: Normocephalic and atraumatic.  Eyes: EOM are normal.  Neck: Normal range of motion.  Cardiovascular: Normal rate, regular rhythm and normal heart sounds.   Pulmonary/Chest: Effort normal and breath sounds normal.  Abdominal: Soft. She exhibits no distension. There is tenderness.  Tenderness to palpation in the suprapubic region and LLQ.  Musculoskeletal: Normal range of motion.  Neurological: She is alert and oriented to person, place, and time.  Skin: Skin is warm and dry.  Psychiatric: She has a normal mood and affect. Judgment normal.  Nursing note and vitals reviewed.    ED Treatments / Results  DIAGNOSTIC STUDIES: Oxygen  Saturation is 100% on RA, normal by my interpretation.    COORDINATION OF CARE: 8:19 PM Discussed treatment plan with pt at bedside which includes abdomen CT and pt agreed to plan.  Labs (all labs ordered are listed, but only abnormal results are displayed) Labs Reviewed - No data to display  EKG  EKG Interpretation None       Radiology No results found.  Procedures Procedures (including critical care time)  Medications Ordered in ED Medications - No data to display   Initial Impression / Assessment and Plan / ED Course  I have reviewed the triage vital signs and the nursing notes.  Pertinent labs & imaging results that were available during my care of the patient were reviewed by me and considered in my medical decision making (see chart for details).  Clinical Course     Patient is a 35 year old female  who is 3 weeks postpartum from a vaginal delivery and tubal ligation. She presents with lower abdominal discomfort. She appears very anxious and tearful and almost appears as though she is having a panic attack.  Her workup reveals no fever and no white count, and she appears nontoxic. She repeatedly asked for pain medication. For this reason, a CT scan was obtained to rule out intra-abdominal process. This did reveal the possibility of retained products of conception, however no abscess or surgical complication from the tubal ligation.  They are recommending an ultrasound to further evaluate. This will be scheduled for tomorrow as there is no ultrasound coverage this evening. Again, the patient appears nontoxic and abdomen is benign. I feel as though it is appropriate to have this study performed tomorrow.  While in the emergency department, the patient requested a note to excuse her from a court date that she has scheduled for tomorrow. She reports she has some sort of custody issue and is due to appear. I do feel as though the patient has a potential medical condition that  does require further evaluation with this ultrasound tomorrow. She was given this note. She will also be given a prescription for pain medication.  She needs to follow-up with her OB/GYN this week.  Final Clinical Impressions(s) / ED Diagnoses   Final diagnoses:  None    New Prescriptions New Prescriptions   No medications on file   I personally performed the services described in this documentation, which was scribed in my presence. The recorded information has been reviewed and is accurate.        Veryl Speak, MD 11/01/16 2241

## 2016-11-01 NOTE — Discharge Instructions (Signed)
Return at the given time tomorrow for an ultrasound of your pelvis to further evaluate your uterus.  Hydrocodone as prescribed as needed for pain.  Please follow-up with your obstetrician this week.

## 2016-11-02 ENCOUNTER — Other Ambulatory Visit (HOSPITAL_COMMUNITY): Payer: Self-pay | Admitting: Emergency Medicine

## 2016-11-02 ENCOUNTER — Encounter (HOSPITAL_COMMUNITY): Payer: Self-pay | Admitting: *Deleted

## 2016-11-02 ENCOUNTER — Emergency Department (HOSPITAL_COMMUNITY)
Admission: EM | Admit: 2016-11-02 | Discharge: 2016-11-02 | Disposition: A | Discharge status: Home / Self Care | Payer: Medicaid Other | Attending: Dermatology | Admitting: Dermatology

## 2016-11-02 ENCOUNTER — Ambulatory Visit (HOSPITAL_COMMUNITY)
Admission: RE | Admit: 2016-11-02 | Discharge: 2016-11-02 | Disposition: A | Discharge status: Home / Self Care | Payer: Medicaid Other | Source: Ambulatory Visit | Attending: Emergency Medicine | Admitting: Emergency Medicine

## 2016-11-02 DIAGNOSIS — Z791 Long term (current) use of non-steroidal anti-inflammatories (NSAID): Secondary | ICD-10-CM | POA: Insufficient documentation

## 2016-11-02 DIAGNOSIS — R102 Pelvic and perineal pain: Secondary | ICD-10-CM | POA: Diagnosis not present

## 2016-11-02 DIAGNOSIS — R109 Unspecified abdominal pain: Secondary | ICD-10-CM | POA: Diagnosis present

## 2016-11-02 DIAGNOSIS — F17210 Nicotine dependence, cigarettes, uncomplicated: Secondary | ICD-10-CM | POA: Diagnosis not present

## 2016-11-02 DIAGNOSIS — Z5321 Procedure and treatment not carried out due to patient leaving prior to being seen by health care provider: Secondary | ICD-10-CM | POA: Diagnosis not present

## 2016-11-02 NOTE — ED Triage Notes (Signed)
Pt comes in with lower abdominal pain starting 3 weeks ago after she had a her baby. States she got a tubal ligation as well. Pt was seen here last night and had many tests ran. She had an outpatient US done today but results are not loaded into her chart yet. Pt anxious in triage.

## 2016-11-02 NOTE — ED Notes (Signed)
Pt was outside smoking when called to be triaged.

## 2016-11-02 NOTE — ED Provider Notes (Signed)
Discussed ultrasound of pelvis with patient and  her mother. She will follow-up with her gynecologist   Nat Christen, MD 11/02/16 1758

## 2017-08-07 IMAGING — CT CT ABD-PELV W/ CM
2 of 5 series · 16 of 46 positions shown, 18 images · IV contrast (iopamidol)
Comparison: 10/31/2015

CLINICAL DATA: Child birth 3 weeks ago with tubal ligation.
Hospitalization for postpartum eclampsia. Headaches and pain around
the abdominal incision.

EXAM:
CT ABDOMEN AND PELVIS WITH CONTRAST
TECHNIQUE: Multidetector CT imaging of the abdomen and pelvis was performed
using the standard protocol following bolus administration of
intravenous contrast.
CONTRAST:  100mL V4YOIC-Z11 IOPAMIDOL (V4YOIC-Z11) INJECTION 61%

[Series 2: axial st · axial · 0.79mm/px · z∈[-498,-128]mm · 13 of 88 slices shown, 15 images]
[im 7/88  soft-tissue]
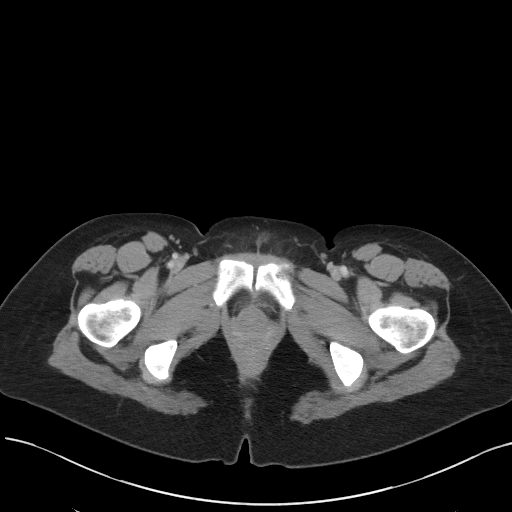
[im 7/88  bone]
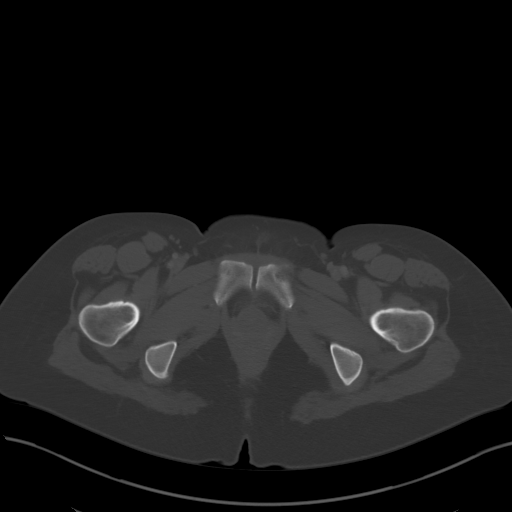
[im 13/88  soft-tissue]
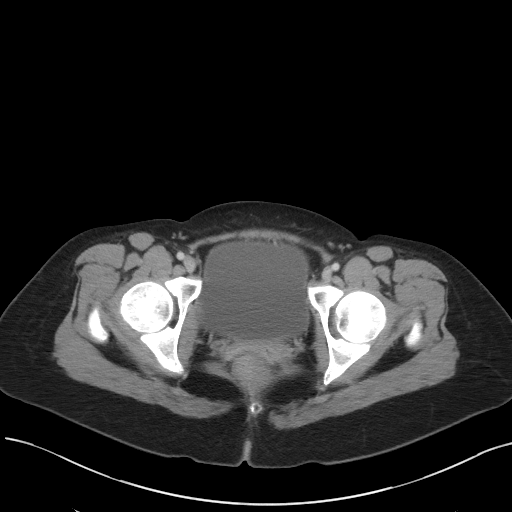
[im 19/88  soft-tissue]
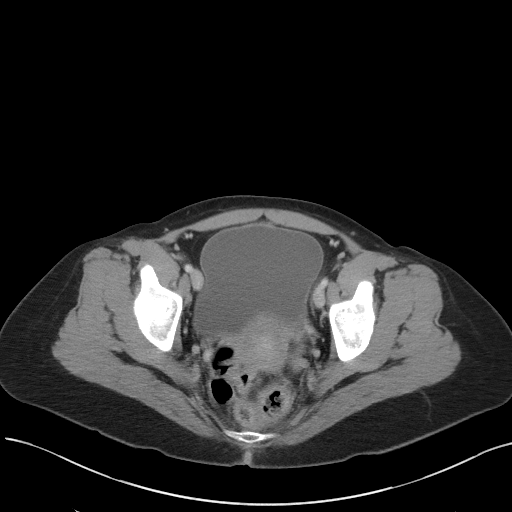
[im 25/88  soft-tissue]
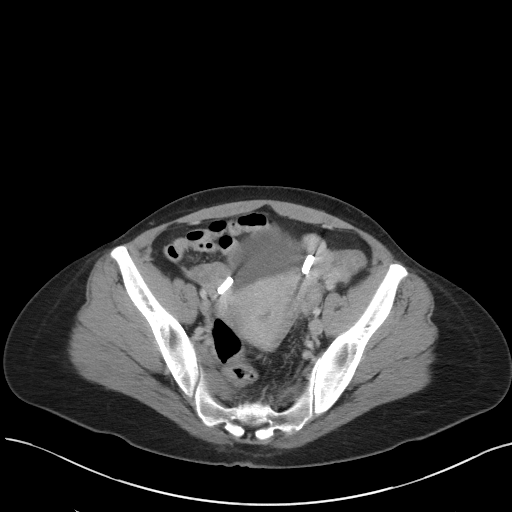
[im 32/88  soft-tissue]
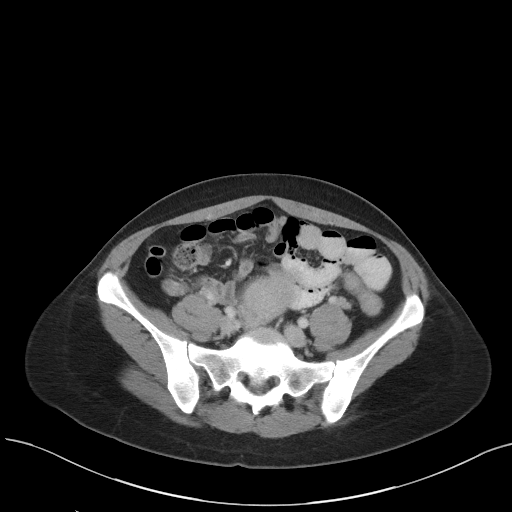
[im 38/88  soft-tissue]
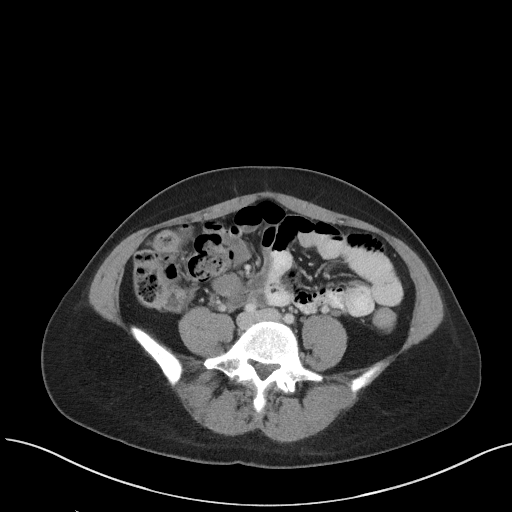
[im 44/88  soft-tissue]
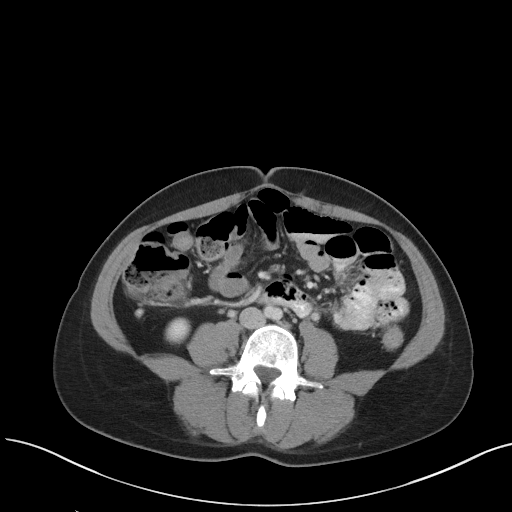
[im 50/88  soft-tissue]
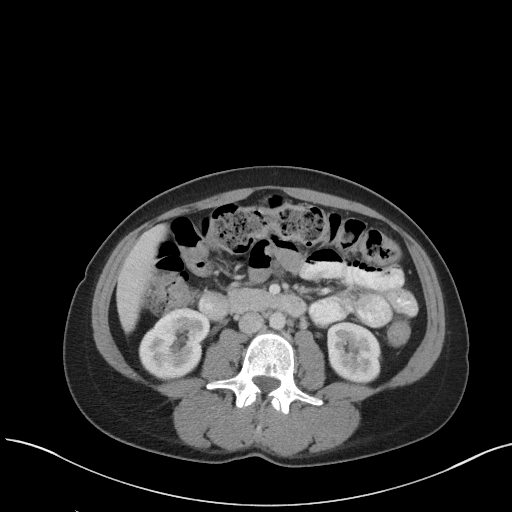
[im 56/88  soft-tissue]
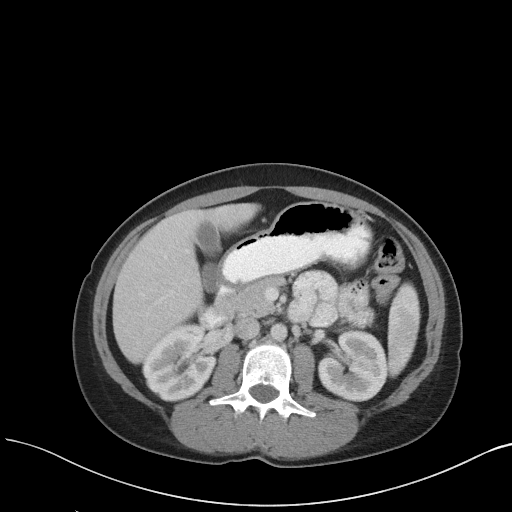
[im 56/88  bone]
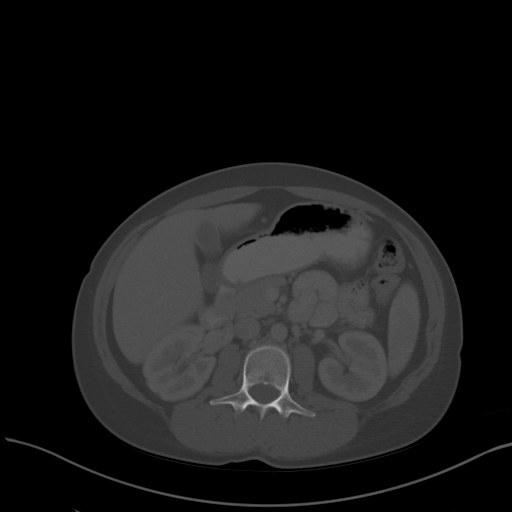
[im 63/88  soft-tissue]
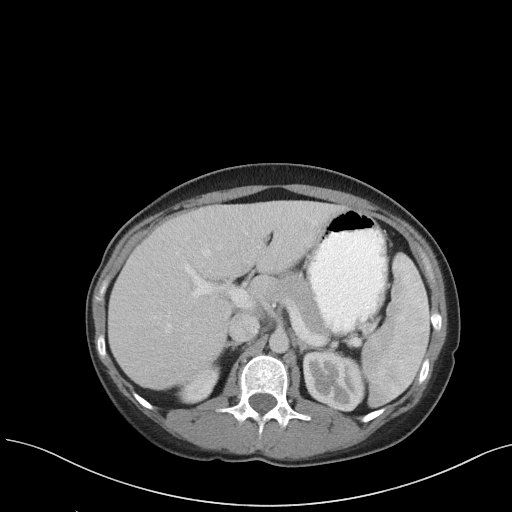
[im 69/88  soft-tissue]
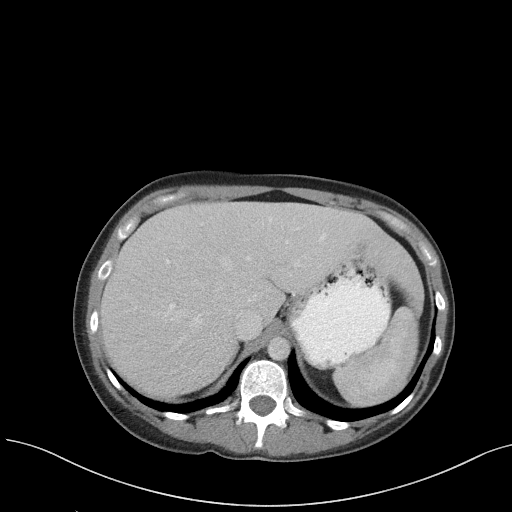
[im 75/88  soft-tissue]
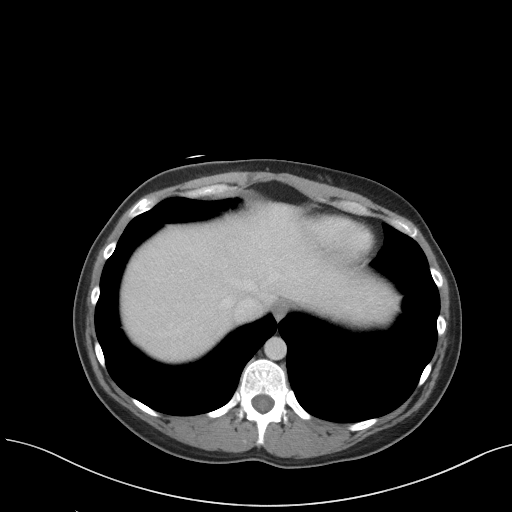
[im 81/88  soft-tissue]
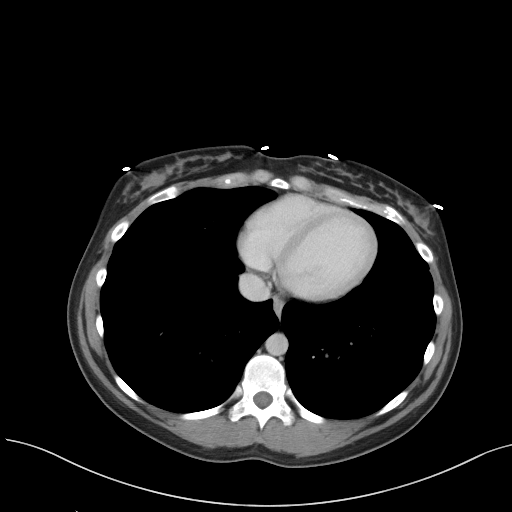

[Series 5: coronal st · coronal · 0.70mm/px · 3 of 80 slices shown]
[im 27/80  soft-tissue]
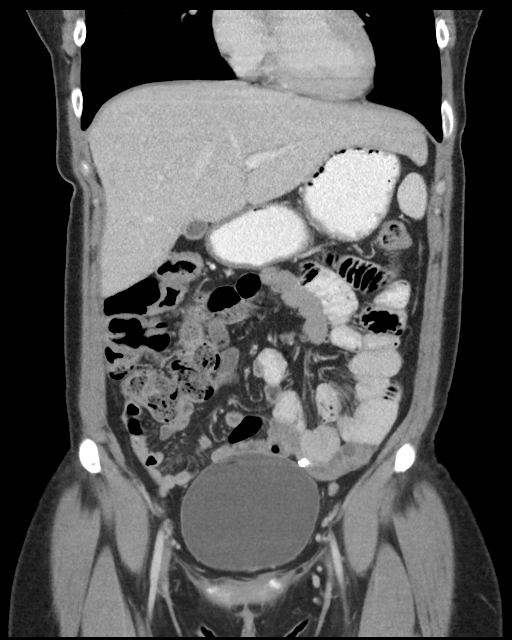
[im 36/80  soft-tissue]
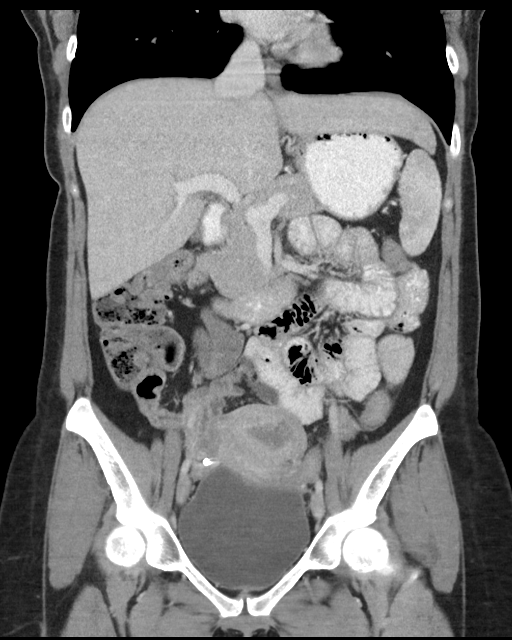
[im 44/80  soft-tissue]
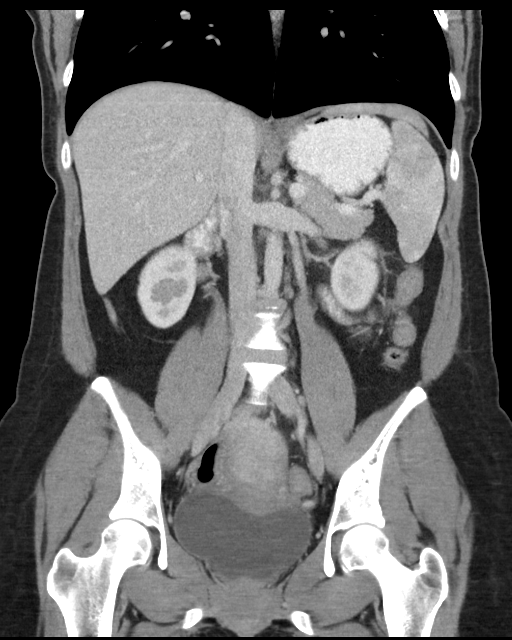

[16 of 46 positions shown; findings below may reference images not displayed]

FINDINGS: Lower chest: No acute abnormality.

Hepatobiliary: No focal liver abnormality is seen. No gallstones,
gallbladder wall thickening, or biliary dilatation.

Pancreas: Unremarkable. No pancreatic ductal dilatation or
surrounding inflammatory changes.

Spleen: Normal in size without focal abnormality.

Adrenals/Urinary Tract: Adrenal glands are unremarkable. Kidneys are
normal, without renal calculi, focal lesion, or hydronephrosis.
Bladder is unremarkable.

Stomach/Bowel: Stomach is within normal limits. Appendix appears
normal. No evidence of bowel wall thickening, distention, or
inflammatory changes.

Vascular/Lymphatic: No significant vascular findings are present. No
enlarged abdominal or pelvic lymph nodes.

Reproductive: Uterus is enlarged consistent with recent postpartum
state. Endometrial cavity is somewhat enlarged and fluid filled. The
endometrium is prominent in the fundus of the uterus. Persistent
hemorrhage or retained products of conception are not excluded.
Consider ultrasound of pelvis for further characterization. No
abnormal adnexal masses. Surgical clips consistent with tubal
ligations.

Other: No abdominal wall hernia or abnormality. No abdominopelvic
ascites.

Musculoskeletal: No destructive bone lesions.
IMPRESSION: Prominent somewhat heterogeneous fluid-filled endometrium. Consider
ultrasound for further evaluation. Otherwise, no acute process
identified.

## 2023-07-19 DIAGNOSIS — K047 Periapical abscess without sinus: Secondary | ICD-10-CM | POA: Diagnosis not present

## 2023-07-19 DIAGNOSIS — Z8659 Personal history of other mental and behavioral disorders: Secondary | ICD-10-CM | POA: Diagnosis not present

## 2023-07-19 DIAGNOSIS — F32A Depression, unspecified: Secondary | ICD-10-CM | POA: Diagnosis not present

## 2023-07-19 DIAGNOSIS — Z8669 Personal history of other diseases of the nervous system and sense organs: Secondary | ICD-10-CM | POA: Diagnosis not present

## 2023-07-19 DIAGNOSIS — Z8541 Personal history of malignant neoplasm of cervix uteri: Secondary | ICD-10-CM | POA: Diagnosis not present

## 2023-07-19 DIAGNOSIS — M5430 Sciatica, unspecified side: Secondary | ICD-10-CM | POA: Diagnosis not present

## 2023-07-19 DIAGNOSIS — K055 Other periodontal diseases: Secondary | ICD-10-CM | POA: Diagnosis not present

## 2023-07-19 DIAGNOSIS — F419 Anxiety disorder, unspecified: Secondary | ICD-10-CM | POA: Diagnosis not present

## 2023-07-19 DIAGNOSIS — K029 Dental caries, unspecified: Secondary | ICD-10-CM | POA: Diagnosis not present

## 2023-07-19 DIAGNOSIS — F17210 Nicotine dependence, cigarettes, uncomplicated: Secondary | ICD-10-CM | POA: Diagnosis not present

## 2023-07-19 DIAGNOSIS — K0889 Other specified disorders of teeth and supporting structures: Secondary | ICD-10-CM | POA: Diagnosis not present

## 2023-07-19 DIAGNOSIS — Z87440 Personal history of urinary (tract) infections: Secondary | ICD-10-CM | POA: Diagnosis not present

## 2024-01-21 DIAGNOSIS — N132 Hydronephrosis with renal and ureteral calculous obstruction: Secondary | ICD-10-CM | POA: Diagnosis not present

## 2024-01-21 DIAGNOSIS — F1910 Other psychoactive substance abuse, uncomplicated: Secondary | ICD-10-CM | POA: Diagnosis not present

## 2024-01-21 DIAGNOSIS — R0602 Shortness of breath: Secondary | ICD-10-CM | POA: Diagnosis not present

## 2024-01-21 DIAGNOSIS — R1031 Right lower quadrant pain: Secondary | ICD-10-CM | POA: Diagnosis not present

## 2024-01-21 DIAGNOSIS — R8281 Pyuria: Secondary | ICD-10-CM | POA: Diagnosis not present

## 2024-01-21 DIAGNOSIS — Z5321 Procedure and treatment not carried out due to patient leaving prior to being seen by health care provider: Secondary | ICD-10-CM | POA: Diagnosis not present

## 2024-01-21 DIAGNOSIS — Z5900 Homelessness unspecified: Secondary | ICD-10-CM | POA: Diagnosis not present

## 2024-01-21 DIAGNOSIS — N10 Acute tubulo-interstitial nephritis: Secondary | ICD-10-CM | POA: Diagnosis not present

## 2024-01-22 DIAGNOSIS — N132 Hydronephrosis with renal and ureteral calculous obstruction: Secondary | ICD-10-CM | POA: Diagnosis not present

## 2024-01-22 DIAGNOSIS — F1910 Other psychoactive substance abuse, uncomplicated: Secondary | ICD-10-CM | POA: Diagnosis not present

## 2024-01-22 DIAGNOSIS — A419 Sepsis, unspecified organism: Secondary | ICD-10-CM | POA: Diagnosis not present

## 2024-01-22 DIAGNOSIS — N10 Acute tubulo-interstitial nephritis: Secondary | ICD-10-CM | POA: Diagnosis not present

## 2024-01-22 DIAGNOSIS — N136 Pyonephrosis: Secondary | ICD-10-CM | POA: Diagnosis not present

## 2024-01-22 DIAGNOSIS — K567 Ileus, unspecified: Secondary | ICD-10-CM | POA: Diagnosis not present

## 2024-01-22 DIAGNOSIS — R0602 Shortness of breath: Secondary | ICD-10-CM | POA: Diagnosis not present

## 2024-01-22 DIAGNOSIS — R400 Somnolence: Secondary | ICD-10-CM | POA: Diagnosis not present

## 2024-01-23 DIAGNOSIS — F1910 Other psychoactive substance abuse, uncomplicated: Secondary | ICD-10-CM | POA: Diagnosis not present

## 2024-01-23 DIAGNOSIS — N1330 Unspecified hydronephrosis: Secondary | ICD-10-CM | POA: Diagnosis not present

## 2024-01-23 DIAGNOSIS — N12 Tubulo-interstitial nephritis, not specified as acute or chronic: Secondary | ICD-10-CM | POA: Diagnosis not present

## 2024-01-23 DIAGNOSIS — N136 Pyonephrosis: Secondary | ICD-10-CM | POA: Diagnosis not present

## 2024-01-23 DIAGNOSIS — N209 Urinary calculus, unspecified: Secondary | ICD-10-CM | POA: Diagnosis not present

## 2024-01-23 DIAGNOSIS — A419 Sepsis, unspecified organism: Secondary | ICD-10-CM | POA: Diagnosis not present

## 2024-01-23 DIAGNOSIS — A411 Sepsis due to other specified staphylococcus: Secondary | ICD-10-CM | POA: Diagnosis not present

## 2024-01-23 DIAGNOSIS — N10 Acute tubulo-interstitial nephritis: Secondary | ICD-10-CM | POA: Diagnosis not present

## 2024-01-23 DIAGNOSIS — R400 Somnolence: Secondary | ICD-10-CM | POA: Diagnosis not present

## 2024-01-23 DIAGNOSIS — Z960 Presence of urogenital implants: Secondary | ICD-10-CM | POA: Diagnosis not present

## 2024-01-23 DIAGNOSIS — K567 Ileus, unspecified: Secondary | ICD-10-CM | POA: Diagnosis not present

## 2024-01-23 DIAGNOSIS — R600 Localized edema: Secondary | ICD-10-CM | POA: Diagnosis not present

## 2024-01-23 DIAGNOSIS — E876 Hypokalemia: Secondary | ICD-10-CM | POA: Diagnosis not present

## 2024-01-23 DIAGNOSIS — N201 Calculus of ureter: Secondary | ICD-10-CM | POA: Diagnosis not present

## 2024-01-23 DIAGNOSIS — N390 Urinary tract infection, site not specified: Secondary | ICD-10-CM | POA: Diagnosis not present

## 2024-01-23 DIAGNOSIS — N132 Hydronephrosis with renal and ureteral calculous obstruction: Secondary | ICD-10-CM | POA: Diagnosis not present

## 2024-01-23 DIAGNOSIS — A4151 Sepsis due to Escherichia coli [E. coli]: Secondary | ICD-10-CM | POA: Diagnosis not present

## 2024-01-24 DIAGNOSIS — N132 Hydronephrosis with renal and ureteral calculous obstruction: Secondary | ICD-10-CM | POA: Diagnosis not present

## 2024-01-24 DIAGNOSIS — A419 Sepsis, unspecified organism: Secondary | ICD-10-CM | POA: Diagnosis not present

## 2024-01-24 DIAGNOSIS — K567 Ileus, unspecified: Secondary | ICD-10-CM | POA: Diagnosis not present

## 2024-01-24 DIAGNOSIS — A411 Sepsis due to other specified staphylococcus: Secondary | ICD-10-CM | POA: Diagnosis not present

## 2024-01-24 DIAGNOSIS — N10 Acute tubulo-interstitial nephritis: Secondary | ICD-10-CM | POA: Diagnosis not present

## 2024-01-24 DIAGNOSIS — N390 Urinary tract infection, site not specified: Secondary | ICD-10-CM | POA: Diagnosis not present

## 2024-01-25 DIAGNOSIS — N132 Hydronephrosis with renal and ureteral calculous obstruction: Secondary | ICD-10-CM | POA: Diagnosis not present

## 2024-01-25 DIAGNOSIS — A411 Sepsis due to other specified staphylococcus: Secondary | ICD-10-CM | POA: Diagnosis not present

## 2024-01-25 DIAGNOSIS — K567 Ileus, unspecified: Secondary | ICD-10-CM | POA: Diagnosis not present

## 2024-01-25 DIAGNOSIS — N10 Acute tubulo-interstitial nephritis: Secondary | ICD-10-CM | POA: Diagnosis not present
